# Patient Record
Sex: Female | Born: 1990 | Race: Black or African American | Hispanic: No | Marital: Single | Smoking: Never smoker
Health system: Southern US, Community
[De-identification: ages and names within clinical notes are randomized; demographics above are authoritative.]

## PROBLEM LIST (undated history)

## (undated) ENCOUNTER — Inpatient Hospital Stay (HOSPITAL_COMMUNITY): Payer: Self-pay

## (undated) DIAGNOSIS — Z8619 Personal history of other infectious and parasitic diseases: Secondary | ICD-10-CM

## (undated) DIAGNOSIS — D649 Anemia, unspecified: Secondary | ICD-10-CM

## (undated) DIAGNOSIS — N200 Calculus of kidney: Secondary | ICD-10-CM

## (undated) DIAGNOSIS — O24419 Gestational diabetes mellitus in pregnancy, unspecified control: Secondary | ICD-10-CM

## (undated) HISTORY — DX: Personal history of other infectious and parasitic diseases: Z86.19

## (undated) HISTORY — PX: NO PAST SURGERIES: SHX2092

---
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
## 2008-11-05 ENCOUNTER — Emergency Department (HOSPITAL_COMMUNITY): Admission: EM | Admit: 2008-11-05 | Discharge: 2008-11-05 | Payer: Self-pay | Admitting: Family Medicine

## 2009-04-26 ENCOUNTER — Emergency Department (HOSPITAL_COMMUNITY): Admission: EM | Admit: 2009-04-26 | Discharge: 2009-04-26 | Payer: Self-pay | Admitting: Emergency Medicine

## 2010-06-18 ENCOUNTER — Emergency Department (HOSPITAL_COMMUNITY)
Admission: EM | Admit: 2010-06-18 | Discharge: 2010-06-18 | Disposition: A | Discharge status: Home / Self Care | Payer: Self-pay | Attending: Emergency Medicine | Admitting: Emergency Medicine

## 2010-06-18 DIAGNOSIS — L02219 Cutaneous abscess of trunk, unspecified: Secondary | ICD-10-CM | POA: Insufficient documentation

## 2010-06-18 DIAGNOSIS — N94819 Vulvodynia, unspecified: Secondary | ICD-10-CM | POA: Insufficient documentation

## 2010-06-18 DIAGNOSIS — A499 Bacterial infection, unspecified: Secondary | ICD-10-CM | POA: Insufficient documentation

## 2010-06-18 DIAGNOSIS — L03319 Cellulitis of trunk, unspecified: Secondary | ICD-10-CM | POA: Insufficient documentation

## 2010-06-18 DIAGNOSIS — N760 Acute vaginitis: Secondary | ICD-10-CM | POA: Insufficient documentation

## 2010-06-18 DIAGNOSIS — R609 Edema, unspecified: Secondary | ICD-10-CM | POA: Insufficient documentation

## 2010-06-18 DIAGNOSIS — B9689 Other specified bacterial agents as the cause of diseases classified elsewhere: Secondary | ICD-10-CM | POA: Insufficient documentation

## 2010-06-18 LAB — WET PREP, GENITAL: Yeast Wet Prep HPF POC: NONE SEEN

## 2010-06-19 LAB — GC/CHLAMYDIA PROBE AMP, GENITAL

## 2010-06-20 ENCOUNTER — Emergency Department (HOSPITAL_COMMUNITY)
Admission: EM | Admit: 2010-06-20 | Discharge: 2010-06-20 | Disposition: A | Discharge status: Home / Self Care | Payer: Self-pay | Attending: Emergency Medicine | Admitting: Emergency Medicine

## 2010-06-20 DIAGNOSIS — L0291 Cutaneous abscess, unspecified: Secondary | ICD-10-CM | POA: Insufficient documentation

## 2010-06-20 DIAGNOSIS — Z09 Encounter for follow-up examination after completed treatment for conditions other than malignant neoplasm: Secondary | ICD-10-CM | POA: Insufficient documentation

## 2011-01-22 LAB — POCT URINALYSIS DIP (DEVICE)
Ketones, ur: 15 — AB
Protein, ur: 100 — AB
Urobilinogen, UA: >=8
pH: 5

## 2011-01-22 LAB — POCT PREGNANCY, URINE: Operator id: 247071

## 2011-01-22 LAB — URINE CULTURE: Culture: NO GROWTH

## 2011-03-25 ENCOUNTER — Encounter: Payer: Self-pay | Admitting: Emergency Medicine

## 2011-03-25 ENCOUNTER — Emergency Department (HOSPITAL_COMMUNITY)
Admission: EM | Admit: 2011-03-25 | Discharge: 2011-03-26 | Disposition: A | Discharge status: Home / Self Care | Payer: Self-pay | Attending: Emergency Medicine | Admitting: Emergency Medicine

## 2011-03-25 DIAGNOSIS — R05 Cough: Secondary | ICD-10-CM | POA: Insufficient documentation

## 2011-03-25 DIAGNOSIS — R059 Cough, unspecified: Secondary | ICD-10-CM | POA: Insufficient documentation

## 2011-03-25 DIAGNOSIS — J3489 Other specified disorders of nose and nasal sinuses: Secondary | ICD-10-CM | POA: Insufficient documentation

## 2011-03-25 DIAGNOSIS — R112 Nausea with vomiting, unspecified: Secondary | ICD-10-CM | POA: Insufficient documentation

## 2011-03-25 DIAGNOSIS — J029 Acute pharyngitis, unspecified: Secondary | ICD-10-CM | POA: Insufficient documentation

## 2011-03-25 NOTE — ED Notes (Signed)
Pt states that yesterday she started having a sore throat and cough.  Only nausea, denies v/d.  Sore throat 6/10.  Difficult to swallow.

## 2011-03-26 MED ORDER — AZITHROMYCIN 250 MG PO TABS: 250.0000 mg | ORAL_TABLET | Freq: Every day | ORAL | Status: AC

## 2011-03-26 NOTE — ED Provider Notes (Signed)
History     CSN: [id number] Arrival date & time: 03/25/2011  8:45 PM   First MD Initiated Contact with Patient 03/26/11 0111      Chief Complaint  Patient presents with  . Sore Throat  . Cough    (Consider location/radiation/quality/duration/timing/severity/associated sxs/prior treatment) Patient is a 20 y.o. female presenting with pharyngitis and cough. The history is provided by the patient.  Sore Throat The current episode started yesterday. The problem occurs constantly. The problem has been gradually worsening. Associated symptoms include congestion, coughing, nausea, a sore throat and vomiting. Pertinent negatives include no abdominal pain, chest pain, chills, fever or rash. The symptoms are aggravated by swallowing and coughing.  Cough Associated symptoms include sore throat. Pertinent negatives include no chest pain, no chills, no shortness of breath and no wheezing.    History reviewed. No pertinent past medical history.  History reviewed. No pertinent past surgical history.  No family history on file.  History  Substance Use Topics  . Smoking status: Never Smoker   . Smokeless tobacco: Not on file  . Alcohol Use: No    OB History    Grav Para Term Preterm Abortions TAB SAB Ect Mult Living                  Review of Systems  Constitutional: Negative for fever and chills.  HENT: Positive for congestion and sore throat.   Respiratory: Positive for cough. Negative for shortness of breath and wheezing.   Cardiovascular: Negative.  Negative for chest pain.  Gastrointestinal: Positive for nausea and vomiting. Negative for abdominal pain.  Musculoskeletal: Negative.   Skin: Negative.  Negative for rash.  Neurological: Negative.     Allergies  Review of patient's allergies indicates no known allergies.  Home Medications  No current outpatient prescriptions on file.  BP 120/65  Pulse 85  Temp(Src) 99.5 F (37.5 C) (Oral)  Resp 18  SpO2 99%  Physical  Exam  Constitutional: She appears well-developed and well-nourished.  HENT:  Head: Normocephalic. No trismus in the jaw.  Nose: Mucosal edema and rhinorrhea present.  Mouth/Throat: Mucous membranes are normal. Posterior oropharyngeal erythema present. No oropharyngeal exudate or tonsillar abscesses.  Neck: Normal range of motion. Neck supple.  Cardiovascular: Normal rate and regular rhythm.   Pulmonary/Chest: Effort normal and breath sounds normal.  Abdominal: Soft. Bowel sounds are normal. There is no tenderness. There is no rebound and no guarding.  Musculoskeletal: Normal range of motion.  Neurological: She is alert. No cranial nerve deficit.  Skin: Skin is warm and dry. No rash noted.  Psychiatric: She has a normal mood and affect.    ED Course  Procedures (including critical care time)  Labs Reviewed - No data to display No results found.   No diagnosis found.    MDM          Rodena Medin, PA 03/26/11 [phone]

## 2011-03-26 NOTE — ED Provider Notes (Signed)
Medical screening examination/treatment/procedure(s) were performed by non-physician practitioner and as supervising physician I was immediately available for consultation/collaboration.  Darald Uzzle M Brendan Gruwell, MD 03/26/11 0820 

## 2012-03-21 ENCOUNTER — Emergency Department (HOSPITAL_COMMUNITY)
Admission: EM | Admit: 2012-03-21 | Discharge: 2012-03-21 | Disposition: A | Discharge status: Home / Self Care | Payer: Self-pay | Attending: Emergency Medicine | Admitting: Emergency Medicine

## 2012-03-21 ENCOUNTER — Encounter (HOSPITAL_COMMUNITY): Payer: Self-pay | Admitting: Emergency Medicine

## 2012-03-21 DIAGNOSIS — N390 Urinary tract infection, site not specified: Secondary | ICD-10-CM | POA: Insufficient documentation

## 2012-03-21 DIAGNOSIS — R300 Dysuria: Secondary | ICD-10-CM | POA: Insufficient documentation

## 2012-03-21 LAB — URINALYSIS, ROUTINE W REFLEX MICROSCOPIC
Hgb urine dipstick: NEGATIVE
Nitrite: POSITIVE — AB
Specific Gravity, Urine: 1.024 (ref 1.005–1.030)
Urobilinogen, UA: 1 mg/dL (ref 0.0–1.0)
pH: 5.5 (ref 5.0–8.0)

## 2012-03-21 LAB — PREGNANCY, URINE: Preg Test, Ur: NEGATIVE

## 2012-03-21 LAB — URINE MICROSCOPIC-ADD ON

## 2012-03-21 MED ORDER — SULFAMETHOXAZOLE-TMP DS 800-160 MG PO TABS
1.0000 | ORAL_TABLET | Freq: Once | ORAL | Status: AC
  Administered 2012-03-21: 1 via ORAL
  Filled 2012-03-21: qty 1

## 2012-03-21 MED ORDER — SULFAMETHOXAZOLE-TRIMETHOPRIM 800-160 MG PO TABS: 1.0000 | ORAL_TABLET | Freq: Two times a day (BID) | ORAL | Status: DC

## 2012-03-21 NOTE — ED Provider Notes (Signed)
Medical screening examination/treatment/procedure(s) were performed by non-physician practitioner and as supervising physician I was immediately available for consultation/collaboration.  Jackson Fetters, MD 03/21/12 2356 

## 2012-03-21 NOTE — ED Notes (Signed)
Patient states that she is having burning with urination and urinary frequency

## 2012-03-21 NOTE — ED Provider Notes (Signed)
History     CSN: [id number]  Arrival date & time 03/21/12  1857   First MD Initiated Contact with Patient 03/21/12 2012      Chief Complaint  Patient presents with  . Urinary Frequency    (Consider location/radiation/quality/duration/timing/severity/associated sxs/prior treatment) HPI Comments: Patient presents with complaint is dysuria and urinary frequency X 2 days. She states that she has had frequent UTI's and that this feels similar. Denies fever or chills. Denies NVD or abdominal pain. Denies vaginal discharge. Denies flank pain. LMP: 11.1.13  The history is provided by the patient. No language interpreter was used.    History reviewed. No pertinent past medical history.  History reviewed. No pertinent past surgical history.  No family history on file.  History  Substance Use Topics  . Smoking status: Never Smoker   . Smokeless tobacco: Not on file  . Alcohol Use: No    OB History    Grav Para Term Preterm Abortions TAB SAB Ect Mult Living                  Review of Systems  Constitutional: Negative for fever and chills.  Gastrointestinal: Negative for nausea, vomiting, abdominal pain and diarrhea.  Genitourinary: Positive for dysuria and frequency. Negative for flank pain and vaginal discharge.    Allergies  Review of patient's allergies indicates no known allergies.  Home Medications   Current Outpatient Rx  Name  Route  Sig  Dispense  Refill  . SULFAMETHOXAZOLE-TRIMETHOPRIM 800-160 MG PO TABS   Oral   Take 1 tablet by mouth every 12 (twelve) hours.   6 tablet   0     BP 120/56  Pulse 66  Temp 98.7 F (37.1 C) (Oral)  Resp 18  SpO2 100%  Physical Exam  Nursing note and vitals reviewed. Constitutional: She appears well-developed and well-nourished.  HENT:  Head: Normocephalic and atraumatic.  Mouth/Throat: Oropharynx is clear and moist.  Eyes: Conjunctivae normal and EOM are normal. No scleral icterus.  Neck: Normal range of motion.  Neck supple.  Cardiovascular: Normal rate, regular rhythm and normal heart sounds.   Pulmonary/Chest: Effort normal and breath sounds normal.  Abdominal: Soft. Bowel sounds are normal. There is no tenderness.       No suprapubic tenderness. No CVA tenderness.  Neurological: She is alert.  Skin: Skin is warm and dry.    ED Course  Procedures (including critical care time)  Labs Reviewed  URINALYSIS, ROUTINE W REFLEX MICROSCOPIC - Abnormal; Notable for the following:    Color, Urine ORANGE (*)  BIOCHEMICALS MAY BE AFFECTED BY COLOR   APPearance CLOUDY (*)     Nitrite POSITIVE (*)     Leukocytes, UA LARGE (*)     All other components within normal limits  URINE MICROSCOPIC-ADD ON - Abnormal; Notable for the following:    Squamous Epithelial / LPF FEW (*)     Bacteria, UA MANY (*)     All other components within normal limits  PREGNANCY, URINE  URINE CULTURE   Results for orders placed during the hospital encounter of 03/21/12  URINALYSIS, ROUTINE W REFLEX MICROSCOPIC      Component Value Range   Color, Urine ORANGE (*) YELLOW   APPearance CLOUDY (*) CLEAR   Specific Gravity, Urine 1.024  1.005 - 1.030   pH 5.5  5.0 - 8.0   Glucose, UA NEGATIVE  NEGATIVE mg/dL   Hgb urine dipstick NEGATIVE  NEGATIVE   Bilirubin Urine NEGATIVE  NEGATIVE  Ketones, ur NEGATIVE  NEGATIVE mg/dL   Protein, ur NEGATIVE  NEGATIVE mg/dL   Urobilinogen, UA 1.0  0.0 - 1.0 mg/dL   Nitrite POSITIVE (*) NEGATIVE   Leukocytes, UA LARGE (*) NEGATIVE  PREGNANCY, URINE      Component Value Range   Preg Test, Ur NEGATIVE  NEGATIVE  URINE MICROSCOPIC-ADD ON      Component Value Range   Squamous Epithelial / LPF FEW (*) RARE   WBC, UA 21-50  <3 WBC/hpf   Bacteria, UA MANY (*) RARE   Urine-Other MUCOUS PRESENT      No results found.   1. UTI (lower urinary tract infection)       MDM  Patient presented with urinary symptoms. Urine remarkable for leukocytes and nitrite. Patient discharged with Rx  for bactrim and return precautions. No red flags for pyelonephritis.         Pixie Casino, PA-C 03/21/12 2127

## 2012-03-22 LAB — URINE CULTURE: Colony Count: 30000

## 2012-04-04 ENCOUNTER — Emergency Department (HOSPITAL_COMMUNITY)
Admission: EM | Admit: 2012-04-04 | Discharge: 2012-04-04 | Disposition: A | Discharge status: Home / Self Care | Payer: Self-pay | Attending: Emergency Medicine | Admitting: Emergency Medicine

## 2012-04-04 DIAGNOSIS — N390 Urinary tract infection, site not specified: Secondary | ICD-10-CM | POA: Insufficient documentation

## 2012-04-04 DIAGNOSIS — Z3202 Encounter for pregnancy test, result negative: Secondary | ICD-10-CM | POA: Insufficient documentation

## 2012-04-04 DIAGNOSIS — N898 Other specified noninflammatory disorders of vagina: Secondary | ICD-10-CM | POA: Insufficient documentation

## 2012-04-04 LAB — URINALYSIS, ROUTINE W REFLEX MICROSCOPIC
Nitrite: POSITIVE — AB
Protein, ur: NEGATIVE mg/dL
Urobilinogen, UA: 1 mg/dL (ref 0.0–1.0)

## 2012-04-04 LAB — URINE MICROSCOPIC-ADD ON

## 2012-04-04 LAB — WET PREP, GENITAL

## 2012-04-04 MED ORDER — CEPHALEXIN 500 MG PO CAPS: 500.0000 mg | ORAL_CAPSULE | Freq: Four times a day (QID) | ORAL | Status: DC

## 2012-04-04 NOTE — ED Provider Notes (Signed)
History     CSN: [id number]  Arrival date & time 04/04/12  1646   First MD Initiated Contact with Patient 04/04/12 2003      Chief Complaint  Patient presents with  . Urinary Tract Infection    (Consider location/radiation/quality/duration/timing/severity/associated sxs/prior treatment) HPI History provided by pt.   Pt presents for the second time in 2 weeks w/ c/o dysuria.  Was diagnosed w/ UTI on 11/25 and prescribed bactrim.  Sx have worsened despite compliance w/ medication.  Now has constant vulvar burning as well as increased urinary frequency.  Now with white vaginal discharge as well.  Denies fever, abd/pelvic/low back pain, vomiting, dyspareunia, genitalia rash.  No PMH.   No past medical history on file.  No past surgical history on file.  No family history on file.  History  Substance Use Topics  . Smoking status: Never Smoker   . Smokeless tobacco: Not on file  . Alcohol Use: No    OB History    Grav Para Term Preterm Abortions TAB SAB Ect Mult Living                  Review of Systems  All other systems reviewed and are negative.    Allergies  Review of patient's allergies indicates no known allergies.  Home Medications  No current outpatient prescriptions on file.  BP 114/61  Pulse 77  Temp 99.1 F (37.3 C)  Resp 15  SpO2 98%  Physical Exam  Nursing note and vitals reviewed. Constitutional: She is oriented to person, place, and time. She appears well-developed and well-nourished. No distress.  HENT:  Head: Normocephalic and atraumatic.  Eyes:       Normal appearance  Neck: Normal range of motion.  Cardiovascular: Normal rate and regular rhythm.   Pulmonary/Chest: Effort normal and breath sounds normal. No respiratory distress.  Abdominal: Soft. Bowel sounds are normal. She exhibits no distension and no mass. There is no tenderness. There is no rebound and no guarding.  Genitourinary:       No CVA tenderness.  Nml external genitalia.  No  vaginal discharge/bleeding.  Cervix closed and appears nml.  No adnexal or cervical motion tenderess.    Musculoskeletal: Normal range of motion.  Neurological: She is alert and oriented to person, place, and time.  Skin: Skin is warm and dry. No rash noted.  Psychiatric: She has a normal mood and affect. Her behavior is normal.    ED Course  Procedures (including critical care time)  Labs Reviewed  URINALYSIS, ROUTINE W REFLEX MICROSCOPIC - Abnormal; Notable for the following:    Color, Urine ORANGE (*)  BIOCHEMICALS MAY BE AFFECTED BY COLOR   APPearance CLOUDY (*)     Bilirubin Urine SMALL (*)     Nitrite POSITIVE (*)     Leukocytes, UA LARGE (*)     All other components within normal limits  WET PREP, GENITAL - Abnormal; Notable for the following:    Yeast Wet Prep HPF POC RARE (*)     WBC, Wet Prep HPF POC FEW (*)     All other components within normal limits  URINE MICROSCOPIC-ADD ON - Abnormal; Notable for the following:    Squamous Epithelial / LPF FEW (*)     Bacteria, UA FEW (*)     All other components within normal limits  PREGNANCY, URINE  GC/CHLAMYDIA PROBE AMP  URINE CULTURE   No results found.   1. Urinary tract infection  MDM  21yo F presents w/ dysuria.  Compliant w/ bactrim, prescribed for UTI in ED on 03/21/12 and sx have worsened.  Now w/ vaginal discharge as well.  On exam, afebrile, abd benign, nml genitalia.  U/A and wet prep pending.  Urine sent for culture as well. 9:04 PM   U/A positive for infection.  Will treat w/ kelfex in case of pyelo, though unlikely based on symptoms and exam.  [facility]  Return precautions discussed. 10:06 PM         Otilio Miu, PA-C 04/04/12 2206

## 2012-04-04 NOTE — ED Notes (Signed)
Bed:WA04<BR> Expected date:<BR> Expected time:<BR> Means of arrival:<BR> Comments:<BR> triage

## 2012-04-04 NOTE — ED Notes (Signed)
Pt was last seen in ED a week ago for uti.  Pt was given antibotic.  Pt reports taking all med but has had no relief.  Pt reports burning with urination and irritation.  Pt denies n/v, chills or fever. Pt does not remember the med prescribed

## 2012-04-04 NOTE — ED Notes (Addendum)
PA at bedside completing pelvic with tech.

## 2012-04-06 ENCOUNTER — Telehealth (HOSPITAL_COMMUNITY): Payer: Self-pay | Admitting: Emergency Medicine

## 2012-04-06 LAB — URINE CULTURE

## 2012-04-06 NOTE — ED Notes (Signed)
Pt called for lab results.  ID verified x 2.  Informed of (+) Chlamydia.  Informed pt chart would go to MD for review and then we would call her w/tx requested per MD.  Pt informed to notify partner(s) for testing and tx and abstain from sex x 2 wks once treated.

## 2012-04-09 NOTE — ED Provider Notes (Signed)
History/physical exam/procedure(s) were performed by non-physician practitioner and as supervising physician I was immediately available for consultation/collaboration. I have reviewed all notes and am in agreement with care and plan.   Nickola Lenig S Clovis Mankins, MD 04/09/12 1349 

## 2012-04-10 ENCOUNTER — Encounter (HOSPITAL_COMMUNITY): Payer: Self-pay | Admitting: *Deleted

## 2012-04-10 ENCOUNTER — Emergency Department (HOSPITAL_COMMUNITY)
Admission: EM | Admit: 2012-04-10 | Discharge: 2012-04-10 | Disposition: A | Discharge status: Home / Self Care | Payer: Self-pay | Attending: Emergency Medicine | Admitting: Emergency Medicine

## 2012-04-10 DIAGNOSIS — L299 Pruritus, unspecified: Secondary | ICD-10-CM | POA: Insufficient documentation

## 2012-04-10 DIAGNOSIS — N390 Urinary tract infection, site not specified: Secondary | ICD-10-CM | POA: Insufficient documentation

## 2012-04-10 DIAGNOSIS — B373 Candidiasis of vulva and vagina: Secondary | ICD-10-CM | POA: Insufficient documentation

## 2012-04-10 DIAGNOSIS — R350 Frequency of micturition: Secondary | ICD-10-CM | POA: Insufficient documentation

## 2012-04-10 DIAGNOSIS — B3731 Acute candidiasis of vulva and vagina: Secondary | ICD-10-CM | POA: Insufficient documentation

## 2012-04-10 DIAGNOSIS — R319 Hematuria, unspecified: Secondary | ICD-10-CM | POA: Insufficient documentation

## 2012-04-10 DIAGNOSIS — M549 Dorsalgia, unspecified: Secondary | ICD-10-CM | POA: Insufficient documentation

## 2012-04-10 LAB — WET PREP, GENITAL: Trich, Wet Prep: NONE SEEN

## 2012-04-10 LAB — URINALYSIS, ROUTINE W REFLEX MICROSCOPIC
Glucose, UA: NEGATIVE mg/dL
Specific Gravity, Urine: 1.025 (ref 1.005–1.030)
Urobilinogen, UA: 1 mg/dL (ref 0.0–1.0)

## 2012-04-10 LAB — URINE MICROSCOPIC-ADD ON

## 2012-04-10 MED ORDER — LEVOFLOXACIN 750 MG PO TABS: 750.0000 mg | ORAL_TABLET | Freq: Every day | ORAL | Status: DC

## 2012-04-10 MED ORDER — CEFTRIAXONE SODIUM 1 G IJ SOLR
500.0000 mg | Freq: Once | INTRAMUSCULAR | Status: AC
  Administered 2012-04-10: 500 mg via INTRAMUSCULAR
  Filled 2012-04-10: qty 10

## 2012-04-10 MED ORDER — LEVOFLOXACIN 500 MG PO TABS
750.0000 mg | ORAL_TABLET | Freq: Once | ORAL | Status: AC
  Administered 2012-04-10: 750 mg via ORAL
  Filled 2012-04-10: qty 2

## 2012-04-10 MED ORDER — LEVOFLOXACIN 500 MG PO TABS: 750.0000 mg | ORAL_TABLET | Freq: Every day | ORAL | Status: DC

## 2012-04-10 MED ORDER — FLUCONAZOLE 150 MG PO TABS: 150.0000 mg | ORAL_TABLET | Freq: Once | ORAL | Status: DC

## 2012-04-10 MED ORDER — LIDOCAINE HCL 1 % IJ SOLN
INTRAMUSCULAR | Status: AC
  Administered 2012-04-10: 2.1 mL
  Filled 2012-04-10: qty 20

## 2012-04-10 MED ORDER — NITROFURANTOIN MONOHYD MACRO 100 MG PO CAPS
100.0000 mg | ORAL_CAPSULE | Freq: Once | ORAL | Status: DC
  Filled 2012-04-10: qty 1

## 2012-04-10 NOTE — ED Notes (Signed)
Pt reports being seen 12/09 for UTI, had previously been treated for UTI and completed all antibiotic medications. 12/09 given another antibiotic for UTI which pt has completed. Pt alerted 12/11 (+) chlamydia, [facility].  Pt reports back pain, nausea, and dysuria, 9/10 pain. Pt reports no relief from dysuria since the first treatment for UTI.  Pt reports when "a little bit of blood" when she wipes after urination.  Back pain started 12/11.

## 2012-04-10 NOTE — ED Provider Notes (Signed)
History     CSN: [id number]  Arrival date & time 04/10/12  1058   First MD Initiated Contact with Patient 04/10/12 1110      No chief complaint on file.   (Consider location/radiation/quality/duration/timing/severity/associated sxs/prior treatment) Lauren Collins is a 21 y.o. female present to the ER for the second time with dysuria frequency and vaginal itch. Patient has been treated twice with Keflex and Bactrim for urinary tract infection however these cultures failed to grow. Patient says she's still having symptoms including back pain, hematuria, dysuria which is a 9/10 in intensity, not associated with vomiting she has had some mild nausea. No diarrhea, no fevers, no chills, no shortness of breath or chest pain. Patient also complains about 2-3 days of cough, mild rhinorrhea. Denies any sore throat or sinus pain or pressure. No headaches.  History reviewed. No pertinent past medical history.  History reviewed. No pertinent past surgical history.  History reviewed. No pertinent family history.  History  Substance Use Topics  . Smoking status: Never Smoker   . Smokeless tobacco: Not on file  . Alcohol Use: No    OB History    Grav Para Term Preterm Abortions TAB SAB Ect Mult Living                  Review of Systems At least 10pt or greater review of systems completed and are negative except where specified in the HPI.  Allergies  Review of patient's allergies indicates no known allergies.  Home Medications   Current Outpatient Rx  Name  Route  Sig  Dispense  Refill  . CEPHALEXIN 500 MG PO CAPS   Oral   Take 1 capsule (500 mg total) by mouth 4 (four) times daily.   20 capsule   0     BP 118/51  Pulse 76  Temp 98.6 F (37 C) (Oral)  Resp 16  SpO2 96%  Physical Exam  Nursing notes reviewed.  Electronic medical record reviewed. VITAL SIGNS:   Filed Vitals:   04/10/12 1102  BP: 118/51  Pulse: 76  Temp: 98.6 F (37 C)  TempSrc: Oral  Resp: 16   SpO2: 96%   CONSTITUTIONAL: Awake, oriented, appears non-toxic HENT: Atraumatic, normocephalic, oral mucosa pink and moist, airway patent. Nares patent with clear drainage and boggy turbinates. External ears normal. EYES: Conjunctiva clear, EOMI, PERRLA NECK: Trachea midline, non-tender, supple CARDIOVASCULAR: Normal heart rate, Normal rhythm, No murmurs, rubs, gallops PULMONARY/CHEST: Clear to auscultation, no rhonchi, wheezes, or rales. Symmetrical breath sounds. Non-tender. ABDOMINAL: Non-distended, soft, non-tender - no rebound or guarding.  BS normal. GU: Patient has white discharge at the vulva with some erythematous irritation and excoriation. NEUROLOGIC: Non-focal, moving all four extremities, no gross sensory or motor deficits. EXTREMITIES: No clubbing, cyanosis, or edema SKIN: Warm, Dry, No erythema, No rash  ED Course  Procedures (including critical care time)  Labs Reviewed  URINALYSIS, ROUTINE W REFLEX MICROSCOPIC - Abnormal; Notable for the following:    APPearance CLOUDY (*)     Hgb urine dipstick TRACE (*)     Leukocytes, UA LARGE (*)     All other components within normal limits  WET PREP, GENITAL - Abnormal; Notable for the following:    Yeast Wet Prep HPF POC FEW (*)     WBC, Wet Prep HPF POC MODERATE (*)     All other components within normal limits  URINE MICROSCOPIC-ADD ON - Abnormal; Notable for the following:    Squamous Epithelial / LPF MANY (*)  Bacteria, UA MANY (*)     All other components within normal limits  URINE CULTURE   No results found.   1. UTI (lower urinary tract infection)   2. Yeast vaginitis       MDM  Lauren Collins is a 21 y.o. female presents with symptoms significant for UTI, I do not think the patient is a pyelonephritis at this time.  Patient's cultures have not pronounced-abdomen polymicrobial consider cultures have been terminated. We'll put the patient on levofloxacin as well as given her a 500 mg dose of IM Rocephin.  We'll also give the patient some fluconazole for her yeast vaginitis. She was also [facility] the [facility] for Chlamydia which was positive (in this ED) and so Rocephin we'll also cover for gonorrhea in case of a false-negative.  Patient is given explicit instructions to return to emergency department if she develops pyelonephritis with symptoms such as worsening back pain, high fevers, vomiting, inability to keep down fluids or any other concerning symptoms. The patient understands and accepts the medical plan as it's been dictated. The patient is STABLE and is discharged to home in good condition.        Jones Skene, MD 04/10/12 1718

## 2012-04-12 LAB — URINE CULTURE: Colony Count: 40000

## 2012-04-14 NOTE — ED Notes (Signed)
+   urine  Patient treated appropriately -sensitive to same-chart appended per protocol MD.  

## 2012-05-04 ENCOUNTER — Emergency Department (HOSPITAL_COMMUNITY)
Admission: EM | Admit: 2012-05-04 | Discharge: 2012-05-04 | Disposition: A | Discharge status: Home / Self Care | Payer: Self-pay | Attending: Emergency Medicine | Admitting: Emergency Medicine

## 2012-05-04 ENCOUNTER — Encounter (HOSPITAL_COMMUNITY): Payer: Self-pay | Admitting: *Deleted

## 2012-05-04 ENCOUNTER — Emergency Department (HOSPITAL_COMMUNITY): Payer: Self-pay

## 2012-05-04 DIAGNOSIS — N23 Unspecified renal colic: Secondary | ICD-10-CM | POA: Insufficient documentation

## 2012-05-04 DIAGNOSIS — N201 Calculus of ureter: Secondary | ICD-10-CM | POA: Insufficient documentation

## 2012-05-04 DIAGNOSIS — R1110 Vomiting, unspecified: Secondary | ICD-10-CM | POA: Insufficient documentation

## 2012-05-04 DIAGNOSIS — R197 Diarrhea, unspecified: Secondary | ICD-10-CM | POA: Insufficient documentation

## 2012-05-04 DIAGNOSIS — Z3202 Encounter for pregnancy test, result negative: Secondary | ICD-10-CM | POA: Insufficient documentation

## 2012-05-04 DIAGNOSIS — R319 Hematuria, unspecified: Secondary | ICD-10-CM | POA: Insufficient documentation

## 2012-05-04 DIAGNOSIS — Z79899 Other long term (current) drug therapy: Secondary | ICD-10-CM | POA: Insufficient documentation

## 2012-05-04 LAB — CBC WITH DIFFERENTIAL/PLATELET
Basophils Relative: 0 % (ref 0–1)
Eosinophils Absolute: 0.1 10*3/uL (ref 0.0–0.7)
Eosinophils Relative: 2 % (ref 0–5)
MCH: 25.8 pg — ABNORMAL LOW (ref 26.0–34.0)
MCHC: 32.7 g/dL (ref 30.0–36.0)
MCV: 78.8 fL (ref 78.0–100.0)
Neutrophils Relative %: 74 % (ref 43–77)
Platelets: 281 10*3/uL (ref 150–400)

## 2012-05-04 LAB — URINALYSIS, ROUTINE W REFLEX MICROSCOPIC
Bilirubin Urine: NEGATIVE
Leukocytes, UA: NEGATIVE
Nitrite: NEGATIVE
Specific Gravity, Urine: 1.022 (ref 1.005–1.030)
Urobilinogen, UA: 0.2 mg/dL (ref 0.0–1.0)
pH: 7.5 (ref 5.0–8.0)

## 2012-05-04 LAB — URINE MICROSCOPIC-ADD ON

## 2012-05-04 LAB — COMPREHENSIVE METABOLIC PANEL
Albumin: 3.8 g/dL (ref 3.5–5.2)
Alkaline Phosphatase: 75 U/L (ref 39–117)
BUN: 13 mg/dL (ref 6–23)
Calcium: 9.2 mg/dL (ref 8.4–10.5)
GFR calc Af Amer: >90 mL/min (ref >90)
Glucose, Bld: 129 mg/dL — ABNORMAL HIGH (ref 70–99)
Potassium: 3.4 mEq/L — ABNORMAL LOW (ref 3.5–5.1)
Sodium: 139 mEq/L (ref 135–145)
Total Protein: 7.7 g/dL (ref 6.0–8.3)

## 2012-05-04 LAB — RAPID URINE DRUG SCREEN, HOSP PERFORMED
Barbiturates: NOT DETECTED
Benzodiazepines: NOT DETECTED
Cocaine: NOT DETECTED
Opiates: NOT DETECTED

## 2012-05-04 LAB — LIPASE, BLOOD: Lipase: 15 U/L (ref 11–59)

## 2012-05-04 MED ORDER — ONDANSETRON HCL 4 MG/2ML IJ SOLN
4.0000 mg | Freq: Once | INTRAMUSCULAR | Status: AC
  Administered 2012-05-04: 4 mg via INTRAVENOUS
  Filled 2012-05-04: qty 2

## 2012-05-04 MED ORDER — OXYCODONE-ACETAMINOPHEN 5-325 MG PO TABS: 1.0000 | ORAL_TABLET | ORAL | Status: DC | PRN

## 2012-05-04 MED ORDER — SODIUM CHLORIDE 0.9 % IV BOLUS (SEPSIS)
1000.0000 mL | Freq: Once | INTRAVENOUS | Status: AC
  Administered 2012-05-04: 1000 mL via INTRAVENOUS

## 2012-05-04 MED ORDER — HYDROMORPHONE HCL PF 1 MG/ML IJ SOLN
1.0000 mg | Freq: Once | INTRAMUSCULAR | Status: AC
  Administered 2012-05-04: 1 mg via INTRAVENOUS
  Filled 2012-05-04: qty 1

## 2012-05-04 MED ORDER — SODIUM CHLORIDE 0.9 % IV SOLN
INTRAVENOUS | Status: DC
  Administered 2012-05-04: 10:00:00 via INTRAVENOUS

## 2012-05-04 NOTE — ED Provider Notes (Signed)
History     CSN: [id number]  Arrival date & time 05/04/12  0556   First MD Initiated Contact with Patient 05/04/12 [phone]      Chief Complaint  Patient presents with  . Abdominal Pain    (Consider location/radiation/quality/duration/timing/severity/associated sxs/prior treatment) HPI Comments: Lauren Collins is a 22 y.o. Female who presents for evaluation of acute onset of right sided abdominal pain, at 4 AM today. It is associated with of vomiting and diarrhea. She denies fever, chills, cough, shortness of breath, weakness, or dizziness. She's not having dysuria, urinary frequency, or back pain. She does not know when her last menstrual cycle was. She uses Depo-Provera. There are no modifying factors.  The history is provided by the patient.    History reviewed. No pertinent past medical history.  History reviewed. No pertinent past surgical history.  History reviewed. No pertinent family history.  History  Substance Use Topics  . Smoking status: Never Smoker   . Smokeless tobacco: Not on file  . Alcohol Use: No    OB History    Grav Para Term Preterm Abortions TAB SAB Ect Mult Living                  Review of Systems  All other systems reviewed and are negative.    Allergies  Review of patient's allergies indicates no known allergies.  Home Medications   Current Outpatient Rx  Name  Route  Sig  Dispense  Refill  . MEDROXYPROGESTERONE ACETATE 150 MG/ML IM SUSP   Intramuscular   Inject 150 mg into the muscle every 3 (three) months.         . OXYCODONE-ACETAMINOPHEN 5-325 MG PO TABS   Oral   Take 1 tablet by mouth every 4 (four) hours as needed for pain.   20 tablet   0     BP 116/64  Pulse 59  Temp 98.4 F (36.9 C)  Resp 16  SpO2 100%  Physical Exam  Nursing note and vitals reviewed. Constitutional: She is oriented to person, place, and time. She appears well-developed and well-nourished.  HENT:  Head: Normocephalic and atraumatic.  Eyes:  Conjunctivae normal and EOM are normal. Pupils are equal, round, and reactive to light.  Neck: Normal range of motion and phonation normal. Neck supple.  Cardiovascular: Normal rate, regular rhythm and intact distal pulses.   Pulmonary/Chest: Effort normal and breath sounds normal. She exhibits no tenderness.  Abdominal: Soft. She exhibits no distension and no mass. There is tenderness. There is no rebound and no guarding.       Mild diffuse, right-sided abdominal tenderness  Musculoskeletal: Normal range of motion.  Neurological: She is alert and oriented to person, place, and time. She has normal strength. She exhibits normal muscle tone.  Skin: Skin is warm and dry.  Psychiatric: She has a normal mood and affect. Her behavior is normal. Judgment and thought content normal.    ED Course  Procedures (including critical care time)  Emergency department treatment: IV fluids, bolus, and drip. IV, Zofran.  Treated for persistent pain with IV, Dilaudid  Reevaluation:10:40-   Labs Reviewed  CBC WITH DIFFERENTIAL - Abnormal; Notable for the following:    MCH 25.8 (*)     All other components within normal limits  COMPREHENSIVE METABOLIC PANEL - Abnormal; Notable for the following:    Potassium 3.4 (*)     Glucose, Bld 129 (*)     All other components within normal limits  URINALYSIS, ROUTINE W  REFLEX MICROSCOPIC - Abnormal; Notable for the following:    APPearance CLOUDY (*)     Hgb urine dipstick LARGE (*)     All other components within normal limits  LIPASE, BLOOD  URINE RAPID DRUG SCREEN (HOSP PERFORMED)  ETHANOL  PREGNANCY, URINE  URINE MICROSCOPIC-ADD ON   Ct Abdomen Pelvis Wo Contrast  05/04/2012  *RADIOLOGY REPORT*  Clinical Data: Right lower quadrant abdominal pain.  CT ABDOMEN AND PELVIS WITHOUT CONTRAST  Technique:  Multidetector CT imaging of the abdomen and pelvis was performed following the standard protocol without intravenous contrast.  Comparison: None  Findings: The  lung bases are clear.  The distal esophagus is unremarkable.  No pleural effusion.  The unenhanced appearance of the liver is unremarkable.  No focal hepatic lesions or intrahepatic ductal dilatation. The gallbladder is normal.  No common bile duct dilatation.  The pancreas is grossly normal.  The spleen is normal in size.  No focal lesions. The adrenal glands and left kidney are normal.  The right kidney demonstrates mild hydronephrosis. There is also mild right hydroureter down to an obstructing 2 mm right UVJ calculus.  The stomach, duodenum, small bowel and colon are grossly normal without oral contrast.  The appendix is visualized and is normal. A small appendicolith is noted.  The uterus and ovaries are unremarkable.  The bladder is normal.  No pelvic mass, adenopathy or free pelvic fluid collections.  No mesenteric or retroperitoneal mass or adenopathy.  The aorta is normal in caliber.  The bony structures are intact.  IMPRESSION:  2 mm right UVJ calculus causing mild right-sided hydroureteronephrosis.   Original Report Authenticated By: Rudie Meyer, M.D.    Nursing notes, applicable records and vitals reviewed.  Radiologic Images/Reports reviewed.   1. Ureteral stone   2. Ureteral colic   3. Hematuria       MDM  Small, distal ureteral stone with secondary pain. No evidence for UTI, or renal dysfunction.Doubt metabolic instability, serious bacterial infection or impending vascular collapse; the patient is stable for discharge.     Plan: Home Medications- Percocet; Home Treatments- strain. Your; Recommended follow up- urology. One week     Flint Melter, MD 05/04/12 1446

## 2012-05-04 NOTE — ED Notes (Signed)
Pt c/o severe right sided abd pain since 4 am; actively vomiting in triage

## 2013-08-28 ENCOUNTER — Emergency Department (HOSPITAL_COMMUNITY)
Admission: EM | Admit: 2013-08-28 | Discharge: 2013-08-28 | Disposition: A | Discharge status: Home / Self Care | Payer: No Typology Code available for payment source | Attending: Emergency Medicine | Admitting: Emergency Medicine

## 2013-08-28 ENCOUNTER — Encounter (HOSPITAL_COMMUNITY): Payer: Self-pay | Admitting: Emergency Medicine

## 2013-08-28 DIAGNOSIS — L0390 Cellulitis, unspecified: Secondary | ICD-10-CM

## 2013-08-28 DIAGNOSIS — L03319 Cellulitis of trunk, unspecified: Principal | ICD-10-CM

## 2013-08-28 DIAGNOSIS — L02219 Cutaneous abscess of trunk, unspecified: Secondary | ICD-10-CM | POA: Insufficient documentation

## 2013-08-28 MED ORDER — SULFAMETHOXAZOLE-TRIMETHOPRIM 800-160 MG PO TABS: 1.0000 | ORAL_TABLET | Freq: Two times a day (BID) | ORAL | Status: DC

## 2013-08-28 MED ORDER — CEPHALEXIN 500 MG PO CAPS: ORAL_CAPSULE | ORAL | Status: DC

## 2013-08-28 NOTE — ED Notes (Signed)
PT states she has lump on chest wall below breasts. Pt watched it for a week in case it was abscess, but has not changed. Pt states it feels hard.

## 2013-08-28 NOTE — Discharge Instructions (Signed)
Cellulitis °Cellulitis is an infection of the skin and the tissue under the skin. The infected area is usually red and tender. This happens most often in the arms and lower legs. °HOME CARE  °· Take your antibiotic medicine as told. Finish the medicine even if you start to feel better. °· Keep the infected arm or leg raised (elevated). °· Put a warm cloth on the area up to 4 times per day. °· Only take medicines as told by your doctor. °· Keep all doctor visits as told. °GET HELP RIGHT AWAY IF:  °· You have a fever. °· You feel very sleepy. °· You throw up (vomit) or have watery poop (diarrhea). °· You feel sick and have muscle aches and pains. °· You see red streaks on the skin coming from the infected area. °· Your red area gets bigger or turns a dark color. °· Your bone or joint under the infected area is painful after the skin heals. °· Your infection comes back in the same area or different area. °· You have a puffy (swollen) bump in the infected area. °· You have new symptoms. °MAKE SURE YOU:  °· Understand these instructions. °· Will watch your condition. °· Will get help right away if you are not doing well or get worse. °Document Released: 09/30/2007 Document Revised: 10/13/2011 Document Reviewed: 06/29/2011 °Exit[employer]® Patient Information ©2014 [employer]. ° °

## 2013-08-28 NOTE — ED Provider Notes (Signed)
CSN: [id number]     Arrival date & time 08/28/13  1518 History  This chart was scribed for non-physician practitioner, Junious SilkHannah Deavion Strider, PA-C working with Shon Batonourtney F Horton, MD by Greggory StallionKayla Andersen, ED scribe. This patient was seen in room WTR5/WTR5 and the patient's care was started at 4:34 PM.   Chief Complaint  Patient presents with  . Mass   The history is provided by the patient. No language interpreter was used.   HPI Comments: Lauren Collins is a 23 y.o. female who presents to the Emergency Department complaining of a lump on her chest wall between her breasts that started 8-9 days. She states it has gotten larger but denies any pain. Pt thought it was an abscess and 'messed' with it. She has taken tylenol with no relief. There has been no drainage from the area. Denies nausea, emesis, trouble breathing, generalized body aches.   History reviewed. No pertinent past medical history. History reviewed. No pertinent past surgical history. History reviewed. No pertinent family history. History  Substance Use Topics  . Smoking status: Never Smoker   . Smokeless tobacco: Not on file  . Alcohol Use: No   OB History   Grav Para Term Preterm Abortions TAB SAB Ect Mult Living                 Review of Systems  Gastrointestinal: Negative for nausea and vomiting.  Musculoskeletal: Negative for myalgias.  Skin: Positive for color change.       Lump on chest wall.  All other systems reviewed and are negative.  Allergies  Review of patient's allergies indicates no known allergies.  Home Medications   Prior to Admission medications   Medication Sig Start Date End Date Taking? Authorizing Provider  medroxyPROGESTERone (DEPO-PROVERA) 150 MG/ML injection Inject 150 mg into the muscle every 3 (three) months.    Historical Provider, MD  oxyCODONE-acetaminophen (PERCOCET/ROXICET) 5-325 MG per tablet Take 1 tablet by mouth every 4 (four) hours as needed for pain. 05/04/12   Flint MelterElliott L Wentz, MD   BP  121/52  Pulse 80  Temp(Src) 99.8 F (37.7 C) (Oral)  Resp 16  SpO2 100%  Physical Exam  Nursing note and vitals reviewed. Constitutional: She is oriented to person, place, and time. She appears well-developed and well-nourished. No distress.  HENT:  Head: Normocephalic and atraumatic.  Right Ear: External ear normal.  Left Ear: External ear normal.  Nose: Nose normal.  Mouth/Throat: Oropharynx is clear and moist.  Eyes: Conjunctivae are normal.  Neck: Normal range of motion.  Cardiovascular: Normal rate, regular rhythm and normal heart sounds.   Pulmonary/Chest: Effort normal and breath sounds normal. No stridor. No respiratory distress. She has no wheezes. She has no rales.  3 cm area of induration with surrounding erythema. No streaking. Warmth to palpation. No drainable abscess.   Abdominal: Soft. She exhibits no distension.  Musculoskeletal: Normal range of motion.  Neurological: She is alert and oriented to person, place, and time. She has normal strength.  Skin: Skin is warm and dry. She is not diaphoretic. No erythema.  Psychiatric: She has a normal mood and affect. Her behavior is normal.    ED Course  Procedures (including critical care time)  DIAGNOSTIC STUDIES: Oxygen Saturation is 100% on RA, normal by my interpretation.    COORDINATION OF CARE: 4:38 PM-Discussed treatment plan which includes keflex and bactrim with pt at bedside and pt agreed to plan. Return precautions given.  Labs Review Labs Reviewed - No data  to display  Imaging Review No results found.   EKG Interpretation None      MDM   Final diagnoses:  Cellulitis    Suspect uncomplicated cellulitis based on limited area of involvement, minimal pain, no systemic signs of illness (eg, fever, chills, dehydration, altered mental status, tachypnea, tachycardia, hypotension), no risk factors for serious illness (eg, extremes of age, general debility, immunocompromised status).   PE reveals  redness, swelling, mildly tender, warm to touch. Skin intact, No bleeding. No bullae. Non purulent. Non circumferential.  Borders are not elevated or sharply demarcated. Drew a line around the area of infection. Pt was instructed to return to the ED if area surpasses the boarder or pain intensifies.   Will prescribed Bactrim to cover for MRSA, direct pt to apply warm compresses and to return to ED for I&D if pain should increase or abscess should develop.    I personally performed the services described in this documentation, which was scribed in my presence. The recorded information has been reviewed and is accurate.  Mora BellmanHannah S Babara Buffalo, PA-C 08/29/13 [phone]

## 2013-08-30 NOTE — ED Provider Notes (Signed)
Medical screening examination/treatment/procedure(s) were performed by non-physician practitioner and as supervising physician I was immediately available for consultation/collaboration.   EKG Interpretation None       Courtney F Horton, MD 08/30/13 0117 

## 2014-12-02 ENCOUNTER — Inpatient Hospital Stay (HOSPITAL_COMMUNITY)
Admission: AD | Admit: 2014-12-02 | Discharge: 2014-12-03 | Disposition: A | Discharge status: Home / Self Care | Payer: 59 | Source: Ambulatory Visit | Attending: Obstetrics and Gynecology | Admitting: Obstetrics and Gynecology

## 2014-12-02 DIAGNOSIS — N760 Acute vaginitis: Secondary | ICD-10-CM | POA: Diagnosis not present

## 2014-12-02 DIAGNOSIS — B9689 Other specified bacterial agents as the cause of diseases classified elsewhere: Secondary | ICD-10-CM

## 2014-12-02 DIAGNOSIS — Z3A01 Less than 8 weeks gestation of pregnancy: Secondary | ICD-10-CM | POA: Diagnosis not present

## 2014-12-02 DIAGNOSIS — R109 Unspecified abdominal pain: Secondary | ICD-10-CM

## 2014-12-02 DIAGNOSIS — O23591 Infection of other part of genital tract in pregnancy, first trimester: Secondary | ICD-10-CM | POA: Diagnosis not present

## 2014-12-02 DIAGNOSIS — O30041 Twin pregnancy, dichorionic/diamniotic, first trimester: Secondary | ICD-10-CM | POA: Diagnosis not present

## 2014-12-02 DIAGNOSIS — O26899 Other specified pregnancy related conditions, unspecified trimester: Secondary | ICD-10-CM

## 2014-12-02 DIAGNOSIS — O9989 Other specified diseases and conditions complicating pregnancy, childbirth and the puerperium: Secondary | ICD-10-CM | POA: Diagnosis not present

## 2014-12-02 NOTE — MAU Note (Signed)
Pt reports she is having cramping and nausea. Reports she has a loose stool every time she eats.

## 2014-12-03 ENCOUNTER — Encounter (HOSPITAL_COMMUNITY): Payer: Self-pay | Admitting: *Deleted

## 2014-12-03 ENCOUNTER — Inpatient Hospital Stay (HOSPITAL_COMMUNITY): Payer: 59

## 2014-12-03 DIAGNOSIS — R109 Unspecified abdominal pain: Secondary | ICD-10-CM | POA: Diagnosis not present

## 2014-12-03 DIAGNOSIS — N760 Acute vaginitis: Secondary | ICD-10-CM

## 2014-12-03 DIAGNOSIS — B9689 Other specified bacterial agents as the cause of diseases classified elsewhere: Secondary | ICD-10-CM

## 2014-12-03 DIAGNOSIS — O23591 Infection of other part of genital tract in pregnancy, first trimester: Secondary | ICD-10-CM

## 2014-12-03 DIAGNOSIS — O9989 Other specified diseases and conditions complicating pregnancy, childbirth and the puerperium: Secondary | ICD-10-CM

## 2014-12-03 LAB — URINALYSIS, ROUTINE W REFLEX MICROSCOPIC
Bilirubin Urine: NEGATIVE
Glucose, UA: NEGATIVE mg/dL
HGB URINE DIPSTICK: NEGATIVE
KETONES UR: NEGATIVE mg/dL
LEUKOCYTES UA: NEGATIVE
NITRITE: NEGATIVE
PROTEIN: NEGATIVE mg/dL
Specific Gravity, Urine: >1.03 — ABNORMAL HIGH (ref 1.005–1.030)
UROBILINOGEN UA: 0.2 mg/dL (ref 0.0–1.0)
pH: 6 (ref 5.0–8.0)

## 2014-12-03 LAB — GC/CHLAMYDIA PROBE AMP (~~LOC~~) NOT AT ARMC
CHLAMYDIA, DNA PROBE: NEGATIVE
NEISSERIA GONORRHEA: NEGATIVE

## 2014-12-03 LAB — CBC
HCT: 36.5 % (ref 36.0–46.0)
Hemoglobin: 11.9 g/dL — ABNORMAL LOW (ref 12.0–15.0)
MCH: 25.5 pg — AB (ref 26.0–34.0)
MCHC: 32.6 g/dL (ref 30.0–36.0)
MCV: 78.2 fL (ref 78.0–100.0)
PLATELETS: 326 10*3/uL (ref 150–400)
RBC: 4.67 MIL/uL (ref 3.87–5.11)
RDW: 14.7 % (ref 11.5–15.5)
WBC: 8.3 10*3/uL (ref 4.0–10.5)

## 2014-12-03 LAB — WET PREP, GENITAL
TRICH WET PREP: NONE SEEN
YEAST WET PREP: NONE SEEN

## 2014-12-03 LAB — HCG, QUANTITATIVE, PREGNANCY: HCG, BETA CHAIN, QUANT, S: 25293 m[IU]/mL — AB (ref <5)

## 2014-12-03 LAB — HIV ANTIBODY (ROUTINE TESTING W REFLEX): HIV SCREEN 4TH GENERATION: NONREACTIVE

## 2014-12-03 LAB — POCT PREGNANCY, URINE: Preg Test, Ur: POSITIVE — AB

## 2014-12-03 MED ORDER — METRONIDAZOLE 500 MG PO TABS: 500.0000 mg | ORAL_TABLET | Freq: Two times a day (BID) | ORAL | Status: DC

## 2014-12-03 NOTE — MAU Provider Note (Signed)
History     CSN: [id number]  Arrival date and time: 12/02/14 2321   First Provider Initiated Contact with Patient 12/03/14 [phone]      Chief Complaint  Patient presents with  . Abdominal Pain   HPI Ms. Lauren Collins is a 24 y.o. G3P0010 at [redacted]w[redacted]d here with report of abdominal cramping x 2 weeks and nausea for approximately one month.  Also reports browning vaginal discharge x 1 week with odor.  Denies vaginal bleeding.    Past Medical History  Diagnosis Date  . Chronic kidney disease     stones    Past Surgical History  Procedure Laterality Date  . No past surgeries      History reviewed. No pertinent family history.  History  Substance Use Topics  . Smoking status: Never Smoker   . Smokeless tobacco: Not on file  . Alcohol Use: No    Allergies: No Known Allergies  No prescriptions prior to admission    Review of Systems  Constitutional: Negative for fever and chills.  Gastrointestinal: Positive for nausea, vomiting and abdominal pain (cramping). Negative for diarrhea and constipation.  Genitourinary: Negative for dysuria, urgency and frequency.  All other systems reviewed and are negative.  Physical Exam   Blood pressure 100/70, pulse 69, temperature 99 F (37.2 C), temperature source Oral, resp. rate 16, height 5' (1.524 m), weight 87.091 kg (192 lb), last menstrual period 10/23/2014, SpO2 100 %.  Physical Exam  Constitutional: She is oriented to person, place, and time. She appears well-developed and well-nourished. No distress.  HENT:  Head: Normocephalic.  Neck: Normal range of motion. Neck supple.  Cardiovascular: Normal rate, regular rhythm and normal heart sounds.  Exam reveals no gallop and no friction rub.   No murmur heard. Respiratory: Effort normal and breath sounds normal. No respiratory distress.  GI: Soft. There is no tenderness. There is no CVA tenderness.  Genitourinary: Uterus is enlarged. Cervix exhibits no motion tenderness and no  discharge. Vaginal discharge (white, creamy) found.  Musculoskeletal: Normal range of motion.  Neurological: She is alert and oriented to person, place, and time.  Skin: Skin is warm and dry.  Psychiatric: She has a normal mood and affect.    MAU Course  Procedures  Results for orders placed or performed during the hospital encounter of 12/02/14 (from the past 24 hour(s))  Urinalysis, Routine w reflex microscopic (not at [facility])     Status: Abnormal   Collection Time: 12/02/14 11:50 PM  Result Value Ref Range   Color, Urine YELLOW YELLOW   APPearance CLEAR CLEAR   Specific Gravity, Urine >1.030 (H) 1.005 - 1.030   pH 6.0 5.0 - 8.0   Glucose, UA NEGATIVE NEGATIVE mg/dL   Hgb urine dipstick NEGATIVE NEGATIVE   Bilirubin Urine NEGATIVE NEGATIVE   Ketones, ur NEGATIVE NEGATIVE mg/dL   Protein, ur NEGATIVE NEGATIVE mg/dL   Urobilinogen, UA 0.2 0.0 - 1.0 mg/dL   Nitrite NEGATIVE NEGATIVE   Leukocytes, UA NEGATIVE NEGATIVE  Pregnancy, urine POC     Status: Abnormal   Collection Time: 12/03/14 12:45 AM  Result Value Ref Range   Preg Test, Ur POSITIVE (A) NEGATIVE  Wet prep, genital     Status: Abnormal   Collection Time: 12/03/14 12:55 AM  Result Value Ref Range   Yeast Wet Prep HPF POC NONE SEEN NONE SEEN   Trich, Wet Prep NONE SEEN NONE SEEN   Clue Cells Wet Prep HPF POC FEW (A) NONE SEEN   WBC, Wet  Prep HPF POC FEW (A) NONE SEEN  CBC     Status: Abnormal   Collection Time: 12/03/14  1:10 AM  Result Value Ref Range   WBC 8.3 4.0 - 10.5 K/uL   RBC 4.67 3.87 - 5.11 MIL/uL   Hemoglobin 11.9 (L) 12.0 - 15.0 g/dL   HCT 78.2 95.6 - 21.3 %   MCV 78.2 78.0 - 100.0 fL   MCH 25.5 (L) 26.0 - 34.0 pg   MCHC 32.6 30.0 - 36.0 g/dL   RDW 08.6 57.8 - 46.9 %   Platelets 326 150 - 400 K/uL    Ultrasound: IMPRESSION: Two intrauterine gestational sacs noted, both with yolk sacs. No embryos are yet seen. The mean sac diameter of 1.1 cm for both gestational sacs corresponds to gestational  ages of 5 weeks 6 days, which match the gestational age by LMP, reflecting an estimated date of delivery of July 30, 2015. The pregnancy is diamniotic dichorionic in nature. Assessment and Plan  24 y.o. G3P0020 at [redacted]w[redacted]d IUP Bacterial Vaginosis Di/Di Gestation  Plan: Discharge to home Begin prenatal care RX Flagyl Pregnancy precautions reviewed Prenatal vitamins  KARIM, Cliffard Hair N 12/03/2014, 3:24 AM

## 2014-12-18 ENCOUNTER — Other Ambulatory Visit: Payer: Self-pay | Admitting: Obstetrics & Gynecology

## 2014-12-18 LAB — OB RESULTS CONSOLE ABO/RH: RH TYPE: POSITIVE

## 2014-12-18 LAB — OB RESULTS CONSOLE RUBELLA ANTIBODY, IGM: Rubella: IMMUNE

## 2014-12-18 LAB — OB RESULTS CONSOLE ANTIBODY SCREEN: Antibody Screen: NEGATIVE

## 2014-12-18 LAB — OB RESULTS CONSOLE HIV ANTIBODY (ROUTINE TESTING): HIV: NONREACTIVE

## 2014-12-18 LAB — OB RESULTS CONSOLE GC/CHLAMYDIA
CHLAMYDIA, DNA PROBE: NEGATIVE
Gonorrhea: NEGATIVE

## 2014-12-18 LAB — OB RESULTS CONSOLE HEPATITIS B SURFACE ANTIGEN: Hepatitis B Surface Ag: NEGATIVE

## 2014-12-18 LAB — OB RESULTS CONSOLE RPR: RPR: NONREACTIVE

## 2014-12-20 LAB — CYTOLOGY - PAP

## 2015-01-06 ENCOUNTER — Encounter (HOSPITAL_COMMUNITY): Payer: Self-pay | Admitting: *Deleted

## 2015-01-06 ENCOUNTER — Inpatient Hospital Stay (HOSPITAL_COMMUNITY)
Admission: AD | Admit: 2015-01-06 | Discharge: 2015-01-06 | Disposition: A | Discharge status: Home / Self Care | Payer: 59 | Source: Ambulatory Visit | Attending: Obstetrics and Gynecology | Admitting: Obstetrics and Gynecology

## 2015-01-06 DIAGNOSIS — R109 Unspecified abdominal pain: Secondary | ICD-10-CM | POA: Insufficient documentation

## 2015-01-06 DIAGNOSIS — Z3A10 10 weeks gestation of pregnancy: Secondary | ICD-10-CM | POA: Diagnosis not present

## 2015-01-06 DIAGNOSIS — O30001 Twin pregnancy, unspecified number of placenta and unspecified number of amniotic sacs, first trimester: Secondary | ICD-10-CM | POA: Diagnosis not present

## 2015-01-06 DIAGNOSIS — O26899 Other specified pregnancy related conditions, unspecified trimester: Secondary | ICD-10-CM

## 2015-01-06 DIAGNOSIS — O9989 Other specified diseases and conditions complicating pregnancy, childbirth and the puerperium: Secondary | ICD-10-CM | POA: Diagnosis not present

## 2015-01-06 DIAGNOSIS — O26893 Other specified pregnancy related conditions, third trimester: Secondary | ICD-10-CM | POA: Insufficient documentation

## 2015-01-06 HISTORY — DX: Anemia, unspecified: D64.9

## 2015-01-06 LAB — URINALYSIS, ROUTINE W REFLEX MICROSCOPIC
BILIRUBIN URINE: NEGATIVE
Glucose, UA: NEGATIVE mg/dL
HGB URINE DIPSTICK: NEGATIVE
Ketones, ur: NEGATIVE mg/dL
Leukocytes, UA: NEGATIVE
NITRITE: NEGATIVE
PH: 6 (ref 5.0–8.0)
Protein, ur: NEGATIVE mg/dL
Urobilinogen, UA: 0.2 mg/dL (ref 0.0–1.0)

## 2015-01-06 NOTE — MAU Note (Signed)
Was treated in August, was unable to take the pills, cream did not help at all

## 2015-01-06 NOTE — MAU Note (Signed)
Yesterday she was having sever back pains (10) and RLQ pain.  The pain has stopped, but she just needed to come in and make sure everything is ok.

## 2015-01-06 NOTE — Discharge Instructions (Signed)

## 2015-01-06 NOTE — MAU Provider Note (Signed)
Chief Complaint: Abdominal Pain and Back Pain   First Provider Initiated Contact with Patient 01/06/15 1907      SUBJECTIVE HPI: Lauren Collins is a 24 y.o. G3P0020 at [redacted]w[redacted]d by LMP who presents to maternity admissions reporting sharp abdominal pain in her lower abdomen and pain in her low back yesterday.  She denies pain today.  She reports since she has a twins pregnancy, she just wanted to come get checked out to be on the safe side. She denies vaginal bleeding, vaginal itching/burning, urinary symptoms, h/a, dizziness, n/v, or fever/chills.     Abdominal Pain This is a new problem. The current episode started yesterday. The onset quality is gradual. The problem occurs intermittently. The most recent episode lasted 24 hours. The problem has been resolved. The pain is located in the LLQ and RLQ. The pain is moderate. The quality of the pain is sharp. The abdominal pain radiates to the back. Pertinent negatives include no constipation, diarrhea, dysuria, fever, frequency, headaches, nausea or vomiting. She has tried nothing for the symptoms.  Back Pain Associated symptoms include abdominal pain. Pertinent negatives include no chest pain, dysuria, fever, headaches, pelvic pain or weakness.    Past Medical History  Diagnosis Date  . Chronic kidney disease     stones  . Anemia    Past Surgical History  Procedure Laterality Date  . No past surgeries     Social History   Social History  . Marital Status: Single    Spouse Name: N/A  . Number of Children: N/A  . Years of Education: N/A   Occupational History  . Not on file.   Social History Main Topics  . Smoking status: Never Smoker   . Smokeless tobacco: Not on file  . Alcohol Use: No  . Drug Use: No  . Sexual Activity: Yes    Birth Control/ Protection: None   Other Topics Concern  . Not on file   Social History Narrative   No current facility-administered medications on file prior to encounter.   Current Outpatient  Prescriptions on File Prior to Encounter  Medication Sig Dispense Refill  . metroNIDAZOLE (FLAGYL) 500 MG tablet Take 1 tablet (500 mg total) by mouth 2 (two) times daily. (Patient not taking: Reported on 01/06/2015) 14 tablet 0   No Known Allergies  ROS:  Review of Systems  Constitutional: Negative for fever, chills and fatigue.  HENT: Negative for sinus pressure.   Eyes: Negative for photophobia.  Respiratory: Negative for shortness of breath.   Cardiovascular: Negative for chest pain.  Gastrointestinal: Positive for abdominal pain. Negative for nausea, vomiting, diarrhea and constipation.  Genitourinary: Negative for dysuria, frequency, flank pain, vaginal bleeding, vaginal discharge, difficulty urinating, vaginal pain and pelvic pain.  Musculoskeletal: Positive for back pain. Negative for neck pain.  Neurological: Negative for dizziness, weakness and headaches.  Psychiatric/Behavioral: Negative.      I have reviewed patient's Past Medical Hx, Surgical Hx, Family Hx, Social Hx, medications and allergies.   Physical Exam  Patient Vitals for the past 24 hrs:  BP Temp Temp src Pulse Resp Weight  01/06/15 1742 116/62 mmHg 98.6 F (37 C) Oral 85 18 87.363 kg (192 lb 9.6 oz)   Constitutional: Well-developed, well-nourished female in no acute distress.  Cardiovascular: normal rate Respiratory: normal effort GI: Abd soft, non-tender. Pos BS x 4 MS: Extremities nontender, no edema, normal ROM Neurologic: Alert and oriented x 4.  GU: Neg CVAT.   FHT 157 and 162 by doppler  LAB RESULTS Results for orders placed or performed during the hospital encounter of 01/06/15 (from the past 24 hour(s))  Urinalysis, Routine w reflex microscopic (not at [facility])     Status: Abnormal   Collection Time: 01/06/15  5:45 PM  Result Value Ref Range   Color, Urine YELLOW YELLOW   APPearance CLEAR CLEAR   Specific Gravity, Urine >1.030 (H) 1.005 - 1.030   pH 6.0 5.0 - 8.0   Glucose, UA NEGATIVE  NEGATIVE mg/dL   Hgb urine dipstick NEGATIVE NEGATIVE   Bilirubin Urine NEGATIVE NEGATIVE   Ketones, ur NEGATIVE NEGATIVE mg/dL   Protein, ur NEGATIVE NEGATIVE mg/dL   Urobilinogen, UA 0.2 0.0 - 1.0 mg/dL   Nitrite NEGATIVE NEGATIVE   Leukocytes, UA NEGATIVE NEGATIVE       IMAGING No results found.  MAU Management/MDM: Ordered labs and reviewed results.  Consult Dr Henderson Cloud, reviewed assessment and labs.  Reassurance provided to pt.  Pt stable at time of discharge.  ASSESSMENT 1. Abdominal pain affecting pregnancy   2. Twin gestation in first trimester, unspecified placenta and amniotic sac number     PLAN Discharge home      Follow-up Information    Call Jelicia Nantz A, MD.   Specialty:  Obstetrics and Gynecology   Why:  If symptoms persist or worsen   Contact information:   [address] [place] [address] [phone]       Follow up with THE [facility] [place] MATERNITY ADMISSIONS.   Why:  As needed for emergencies   Contact information:   [address] [id number] mc [place] [address] [phone]      Sharen Counter Certified Nurse-Midwife 01/06/2015  7:15 PM

## 2015-01-17 ENCOUNTER — Other Ambulatory Visit: Payer: Self-pay | Admitting: Obstetrics and Gynecology

## 2015-01-21 LAB — CYTOLOGY - PAP

## 2015-03-12 ENCOUNTER — Other Ambulatory Visit: Payer: Self-pay | Admitting: Obstetrics and Gynecology

## 2015-04-10 ENCOUNTER — Other Ambulatory Visit: Payer: Self-pay | Admitting: Obstetrics and Gynecology

## 2015-04-10 DIAGNOSIS — N6325 Unspecified lump in the left breast, overlapping quadrants: Secondary | ICD-10-CM

## 2015-04-10 DIAGNOSIS — N6320 Unspecified lump in the left breast, unspecified quadrant: Principal | ICD-10-CM

## 2015-04-12 ENCOUNTER — Ambulatory Visit
Admission: RE | Admit: 2015-04-12 | Discharge: 2015-04-12 | Disposition: A | Discharge status: Home / Self Care | Payer: 59 | Source: Ambulatory Visit | Attending: Obstetrics and Gynecology | Admitting: Obstetrics and Gynecology

## 2015-04-12 DIAGNOSIS — N6320 Unspecified lump in the left breast, unspecified quadrant: Principal | ICD-10-CM

## 2015-04-12 DIAGNOSIS — N6325 Unspecified lump in the left breast, overlapping quadrants: Secondary | ICD-10-CM

## 2015-05-09 ENCOUNTER — Encounter (HOSPITAL_COMMUNITY): Payer: Self-pay | Admitting: *Deleted

## 2015-05-09 ENCOUNTER — Inpatient Hospital Stay (HOSPITAL_COMMUNITY): Payer: 59

## 2015-05-09 ENCOUNTER — Observation Stay (HOSPITAL_COMMUNITY)
Admission: AD | Admit: 2015-05-09 | Discharge: 2015-05-10 | Disposition: A | Discharge status: Home / Self Care | Payer: 59 | Source: Ambulatory Visit | Attending: Obstetrics | Admitting: Obstetrics

## 2015-05-09 DIAGNOSIS — N760 Acute vaginitis: Secondary | ICD-10-CM

## 2015-05-09 DIAGNOSIS — O23593 Infection of other part of genital tract in pregnancy, third trimester: Principal | ICD-10-CM | POA: Insufficient documentation

## 2015-05-09 DIAGNOSIS — O09893 Supervision of other high risk pregnancies, third trimester: Secondary | ICD-10-CM

## 2015-05-09 DIAGNOSIS — Z3A28 28 weeks gestation of pregnancy: Secondary | ICD-10-CM | POA: Diagnosis not present

## 2015-05-09 DIAGNOSIS — B9689 Other specified bacterial agents as the cause of diseases classified elsewhere: Secondary | ICD-10-CM

## 2015-05-09 DIAGNOSIS — Z87442 Personal history of urinary calculi: Secondary | ICD-10-CM | POA: Diagnosis not present

## 2015-05-09 DIAGNOSIS — O09213 Supervision of pregnancy with history of pre-term labor, third trimester: Secondary | ICD-10-CM

## 2015-05-09 DIAGNOSIS — O30043 Twin pregnancy, dichorionic/diamniotic, third trimester: Secondary | ICD-10-CM | POA: Diagnosis not present

## 2015-05-09 DIAGNOSIS — O24410 Gestational diabetes mellitus in pregnancy, diet controlled: Secondary | ICD-10-CM | POA: Diagnosis not present

## 2015-05-09 DIAGNOSIS — O4700 False labor before 37 completed weeks of gestation, unspecified trimester: Secondary | ICD-10-CM | POA: Diagnosis present

## 2015-05-09 DIAGNOSIS — Z862 Personal history of diseases of the blood and blood-forming organs and certain disorders involving the immune mechanism: Secondary | ICD-10-CM | POA: Diagnosis not present

## 2015-05-09 DIAGNOSIS — O368390 Maternal care for abnormalities of the fetal heart rate or rhythm, unspecified trimester, not applicable or unspecified: Secondary | ICD-10-CM

## 2015-05-09 DIAGNOSIS — O4693 Antepartum hemorrhage, unspecified, third trimester: Secondary | ICD-10-CM | POA: Diagnosis present

## 2015-05-09 HISTORY — DX: Calculus of kidney: N20.0

## 2015-05-09 HISTORY — DX: Gestational diabetes mellitus in pregnancy, unspecified control: O24.419

## 2015-05-09 LAB — URINALYSIS, ROUTINE W REFLEX MICROSCOPIC
Bilirubin Urine: NEGATIVE
GLUCOSE, UA: NEGATIVE mg/dL
HGB URINE DIPSTICK: NEGATIVE
Ketones, ur: 15 mg/dL — AB
Leukocytes, UA: NEGATIVE
Nitrite: NEGATIVE
Protein, ur: NEGATIVE mg/dL
Specific Gravity, Urine: <1.005 — ABNORMAL LOW (ref 1.005–1.030)
pH: 6 (ref 5.0–8.0)

## 2015-05-09 LAB — WET PREP, GENITAL
Sperm: NONE SEEN
Trich, Wet Prep: NONE SEEN
Yeast Wet Prep HPF POC: NONE SEEN

## 2015-05-09 LAB — GLUCOSE, CAPILLARY: Glucose-Capillary: 127 mg/dL — ABNORMAL HIGH (ref 65–99)

## 2015-05-09 LAB — TYPE AND SCREEN
ABO/RH(D): O POS
ANTIBODY SCREEN: NEGATIVE

## 2015-05-09 LAB — ABO/RH: ABO/RH(D): O POS

## 2015-05-09 MED ORDER — ZOLPIDEM TARTRATE 5 MG PO TABS: 5.0000 mg | ORAL_TABLET | Freq: Every evening | ORAL | Status: DC | PRN

## 2015-05-09 MED ORDER — LACTATED RINGERS IV BOLUS (SEPSIS)
1000.0000 mL | Freq: Once | INTRAVENOUS | Status: AC
  Administered 2015-05-09: 1000 mL via INTRAVENOUS

## 2015-05-09 MED ORDER — ACETAMINOPHEN 325 MG PO TABS: 650.0000 mg | ORAL_TABLET | ORAL | Status: DC | PRN

## 2015-05-09 MED ORDER — PRENATAL MULTIVITAMIN CH
1.0000 | ORAL_TABLET | Freq: Every day | ORAL | Status: DC
  Administered 2015-05-10: 1 via ORAL
  Filled 2015-05-09: qty 1

## 2015-05-09 MED ORDER — CALCIUM CARBONATE ANTACID 500 MG PO CHEW: 2.0000 | CHEWABLE_TABLET | ORAL | Status: DC | PRN

## 2015-05-09 MED ORDER — BETAMETHASONE SOD PHOS & ACET 6 (3-3) MG/ML IJ SUSP
12.0000 mg | INTRAMUSCULAR | Status: AC
  Administered 2015-05-09 – 2015-05-10 (×2): 12 mg via INTRAMUSCULAR
  Filled 2015-05-09 (×2): qty 2

## 2015-05-09 MED ORDER — FERROUS SULFATE 325 (65 FE) MG PO TABS
325.0000 mg | ORAL_TABLET | Freq: Two times a day (BID) | ORAL | Status: DC
  Administered 2015-05-10 (×2): 325 mg via ORAL
  Filled 2015-05-09 (×2): qty 1

## 2015-05-09 MED ORDER — METRONIDAZOLE 500 MG PO TABS
500.0000 mg | ORAL_TABLET | Freq: Two times a day (BID) | ORAL | Status: DC
  Administered 2015-05-09 – 2015-05-10 (×2): 500 mg via ORAL
  Filled 2015-05-09 (×4): qty 1

## 2015-05-09 MED ORDER — MAGNESIUM SULFATE BOLUS VIA INFUSION
4.0000 g | Freq: Once | INTRAVENOUS | Status: AC
  Administered 2015-05-09: 4 g via INTRAVENOUS
  Filled 2015-05-09: qty 500

## 2015-05-09 MED ORDER — DOCUSATE SODIUM 100 MG PO CAPS
100.0000 mg | ORAL_CAPSULE | Freq: Every day | ORAL | Status: DC
  Administered 2015-05-10: 100 mg via ORAL
  Filled 2015-05-09: qty 1

## 2015-05-09 MED ORDER — MAGNESIUM SULFATE 50 % IJ SOLN
2.0000 g/h | INTRAVENOUS | Status: DC
  Filled 2015-05-09: qty 80

## 2015-05-09 NOTE — MAU Note (Signed)
Pt presents to MAU with complaints of vaginal bleeding that started 30 minutes ago. Reports mild abdominal cramping

## 2015-05-09 NOTE — MAU Provider Note (Signed)
History     CSN: [id number]  Arrival date and time: 05/09/15 1426   First Provider Initiated Contact with Patient 05/09/15 1545      Chief Complaint  Patient presents with  . Vaginal Bleeding   HPI   Ms.Lauren Collins is a 25 y.o. female G3P0020 at [redacted]w[redacted]d presenting to MAU with vaginal bleeding. The patient was using the bathroom to urinate about an hour ago and she noted bright red spotting on the tissue. This is the first time she has ever had spotting in the pregnancy.   She reports dime size blood on her pad and reports that the blood covered her pad.   She also complains of mild contraction like pain that she has been experiencing for a while now. The pain comes and goes and when it comes she rates the pain 8/10. No recent intercourse.   + fetal movement.   OB History    Gravida Para Term Preterm AB TAB SAB Ectopic Multiple Living   3    2 1 1    0      Past Medical History  Diagnosis Date  . Chronic kidney disease     stones  . Anemia   . Gestational diabetes     Past Surgical History  Procedure Laterality Date  . No past surgeries      History reviewed. No pertinent family history.  Social History  Substance Use Topics  . Smoking status: Never Smoker   . Smokeless tobacco: None  . Alcohol Use: No    Allergies: No Known Allergies  Prescriptions prior to admission  Medication Sig Dispense Refill Last Dose  . ferrous sulfate 325 (65 FE) MG tablet Take 325 mg by mouth 2 (two) times daily.   Past Week at Unknown time  . Prenatal Vit-Min-FA-Fish Oil ([employer] PRENATAL GUMMY PO) Take 2 each by mouth daily.   Past Week at Unknown time   Results for orders placed or performed during the hospital encounter of 05/09/15 (from the past 48 hour(s))  Urinalysis, Routine w reflex microscopic (not at [facility])     Status: Abnormal   Collection Time: 05/09/15  3:34 PM  Result Value Ref Range   Color, Urine YELLOW YELLOW   APPearance CLEAR CLEAR   Specific Gravity, Urine  <1.005 (L) 1.005 - 1.030   pH 6.0 5.0 - 8.0   Glucose, UA NEGATIVE NEGATIVE mg/dL   Hgb urine dipstick NEGATIVE NEGATIVE   Bilirubin Urine NEGATIVE NEGATIVE   Ketones, ur 15 (A) NEGATIVE mg/dL   Protein, ur NEGATIVE NEGATIVE mg/dL   Nitrite NEGATIVE NEGATIVE   Leukocytes, UA NEGATIVE NEGATIVE    Comment: MICROSCOPIC NOT DONE ON URINES WITH NEGATIVE PROTEIN, BLOOD, LEUKOCYTES, NITRITE, OR GLUCOSE <1000 mg/dL.  Wet prep, genital     Status: Abnormal   Collection Time: 05/09/15  4:00 PM  Result Value Ref Range   Yeast Wet Prep HPF POC NONE SEEN NONE SEEN   Trich, Wet Prep NONE SEEN NONE SEEN   Clue Cells Wet Prep HPF POC PRESENT (A) NONE SEEN   WBC, Wet Prep HPF POC MODERATE (A) NONE SEEN    Comment: MANY BACTERIA SEEN   Sperm NONE SEEN   ABO/Rh     Status: None (Preliminary result)   Collection Time: 05/09/15  5:09 PM  Result Value Ref Range   ABO/RH(D) O POS     Review of Systems  Constitutional: Negative for fever and chills.  Gastrointestinal: Negative for nausea, vomiting and constipation.  Physical Exam   Blood pressure 125/67, pulse 103, temperature 98 F (36.7 C), resp. rate 18, last menstrual period 10/23/2014.  Physical Exam  Constitutional: She is oriented to person, place, and time. She appears well-developed and well-nourished. No distress.  HENT:  Head: Normocephalic.  Eyes: Pupils are equal, round, and reactive to light.  Neck: Neck supple.  Respiratory: Effort normal.  GI: Soft. She exhibits no distension. There is no tenderness. There is no rebound and no guarding.  Genitourinary:  Dilation: Closed Effacement (%): Thick Exam by:: J. Barrie Wale NP  Speculum exam: Vagina - Moderate amount of creamy, thick, white discharge, no odor Cervix - No contact bleeding, cervix appears closed. Bimanual exam: Deferred initially.  Chaperone present for exam.  Musculoskeletal: Normal range of motion.  Neurological: She is alert and oriented to person, place, and time.   Skin: Skin is warm. She is not diaphoretic.  Psychiatric: Her behavior is normal.   Fetal Tracing: A Baseline: 135 bpm Variability: moderate  Accelerations: 15x15 Decelerations: none  Toco: Q2-3 mins apart.   Fetal Tracing: B Baseline: 145 bpm  Variability: moderate  Accelerations: 10x10 and 15x15 Decelerations: quick variables.     MAU Course  Procedures  None  MDM  US, OB limited to evaluate placenta ABO : O positive blood type  1 liter bolus of LR given will monitor and reassess contraction pattern and pain level. Discussed patient with Dr. Chestine Sporelark.   Cervix unchanged with second cervical exam Discussed US findings-report with Dr. Chestine Sporelark.  Contraction pattern unchanged, patient continues to report contraction pain at 8/10   Assessment and Plan   A:  1. Risk of preterm labor, third trimester   2. Variable fetal heart rate decelerations, antepartum   3. Fetal heart rate decelerations affecting management of mother   4. [redacted] weeks gestation of pregnancy   5. Third trimester bleeding   6. Dichorionic diamniotic twin pregnancy in third trimester   7. BV (bacterial vaginosis)    P:  Admit to [facility] per Dr. Chestine Sporelark.  Betamethasone Rapid GBS Magnesium   Flagyl 500 mg BID    Duane LopeJennifer I Danely Bayliss, NP 05/09/2015 7:29 PM

## 2015-05-09 NOTE — H&P (Signed)
25 y.o. [id number] @ 514w2d presents with c/o vaginal bleeding and mild cramping. She noted a dime sized amount of blood w urination. Vaginal exam was unremarkable and SVE was closed and long.  During fetal monitoring in MAU, she noted increasingly painful contractions.  A 1L fluid bolus  Did not alleviate painful contractions. Reports pain is 8/10, though NP notes patient does not appear painful with contractions. Although cervix remained closed/long, given increasingly painful contractions q3 minutes, will admit for observation.  Pregnancy c/b: 1. Di-di twin pregnancy: growth US 12/30 A-vtx-1078g (67%), B breech 975g (56%).  Vtx/breech on US today 2. Anemia--hgb 9.2 at 28 weeks--on PO iron 3. GDM--nutritional consultation is pending given recent diagnosis.   Past Medical History  Diagnosis Date  . Kidney stones   . Anemia   . Gestational diabetes     Past Surgical History  Procedure Laterality Date  . No past surgeries      OB History  Gravida Para Term Preterm AB SAB TAB Ectopic Multiple Living  3    2 1 1    0    # Outcome Date GA Lbr Len/2nd Weight Sex Delivery Anes PTL Lv  3 Current           2 SAB           1 TAB               Social History   Social History  . Marital Status: Single    Spouse Name: N/A  . Number of Children: N/A  . Years of Education: N/A   Occupational History  . Not on file.   Social History Main Topics  . Smoking status: Never Smoker   . Smokeless tobacco: Not on file  . Alcohol Use: No  . Drug Use: No  . Sexual Activity: Yes    Birth Control/ Protection: None   Other Topics Concern  . Not on file   Social History Narrative   Review of patient's allergies indicates no known allergies.    Prenatal Transfer Tool  Maternal Diabetes: Yes:  Diabetes Type:  Diet controlled Genetic Screening: Normal Maternal Ultrasounds/Referrals: Normal Fetal Ultrasounds or other Referrals:  None Maternal Substance Abuse:  No Significant Maternal  Medications:  Meds include: Other:  Significant Maternal Lab Results: Lab values include: Other:    Filed Vitals:   05/09/15 1452  BP: 128/63  Pulse: 97  Temp: 98 F (36.7 C)  Resp: 18     General:  NAD Abdomen:  soft, gravid SVE:  Closed/long per NP, no blood in vagina FHTs:  A--140s, mod var, + accels, no decels.  B--140s, mod var, + accels, no decels Toco:  q3 min  A vtx, bpp 8/10 (-2 breathing), B breech, bpp 8/10 (-2 breathing)   A/P   25 y.o. G3P0020 [redacted]w[redacted]d presents with vaginal bleeding and preterm contractions. Given increasingly painful contractions at 28 weeks, will admit to AP.  BMZ and magnesium now.  Collect gbs.  Will hold ampicillin unless active labor GDM--will check Fasting/2h pp Anemia--po iron BPP 8/10 x 2 (-2 for breathing).  Continuous fetal monitoring while contracting, but fetal status overall reassuring.    [facility]NA GEFFEL [employer]

## 2015-05-10 DIAGNOSIS — O23593 Infection of other part of genital tract in pregnancy, third trimester: Secondary | ICD-10-CM | POA: Diagnosis not present

## 2015-05-10 LAB — CBC
HEMATOCRIT: 29.9 % — AB (ref 36.0–46.0)
HEMOGLOBIN: 9.4 g/dL — AB (ref 12.0–15.0)
MCH: 25.1 pg — AB (ref 26.0–34.0)
MCHC: 31.4 g/dL (ref 30.0–36.0)
MCV: 79.9 fL (ref 78.0–100.0)
Platelets: 274 10*3/uL (ref 150–400)
RBC: 3.74 MIL/uL — ABNORMAL LOW (ref 3.87–5.11)
RDW: 15.3 % (ref 11.5–15.5)
WBC: 10.9 10*3/uL — ABNORMAL HIGH (ref 4.0–10.5)

## 2015-05-10 LAB — GLUCOSE, CAPILLARY
Glucose-Capillary: 108 mg/dL — ABNORMAL HIGH (ref 65–99)
Glucose-Capillary: 116 mg/dL — ABNORMAL HIGH (ref 65–99)

## 2015-05-10 LAB — GC/CHLAMYDIA PROBE AMP (~~LOC~~) NOT AT ARMC
Chlamydia: NEGATIVE
Neisseria Gonorrhea: NEGATIVE

## 2015-05-10 MED ORDER — LACTATED RINGERS IV SOLN
INTRAVENOUS | Status: DC
  Administered 2015-05-09 – 2015-05-10 (×2): via INTRAVENOUS

## 2015-05-10 NOTE — Progress Notes (Addendum)
Patient is comfortable, reports occ. Mild ctx not different from usual ctx she experienced over past week, stronger contractions from last night have resolved.  She denies LOF, VB. k She reports good FM and no other problems  Filed Vitals:   05/10/15 0500 05/10/15 0600 05/10/15 0742 05/10/15 1124  BP: 123/65 140/86 131/65 115/64  Pulse: 96 96 98 105  Temp:   98.3 F (36.8 C) 97.8 F (36.6 C)  TempSrc:      Resp:   16 16  Height:      Weight:      SpO2: 98% 96% 99%     FHT:A 125 mod var 10x10 accels FHT:B 125 mod var 10 x 10 accls SVE:  Closed at admission TOCO: irritable  Lab Results  Component Value Date   WBC 10.9* 05/10/2015   HGB 9.4* 05/10/2015   HCT 29.9* 05/10/2015   MCV 79.9 05/10/2015   PLT 274 05/10/2015    --/--/O POS (01/12 1907)  A/P 28.3 with di/di twins and contractions Ctx have improved.  Discussed with patient plan to continue monitoring today.  Given ctx have resolved will discontinue MgSO4 at this time.  Will recheck cervix this evening and administer second dose of steriods.  If stable will discharge home.  Routine care.   Philip AspenALLAHAN, Varun Jourdan

## 2015-05-10 NOTE — Progress Notes (Signed)
Pt ambulated off unit for discharge with family members at side. No further concerns.

## 2015-05-10 NOTE — Progress Notes (Signed)
Nutrition: Pt provided with copy of "My meal plan for Gestational Diabetes". Additional instruction sheet provided along with examples of snacks. Reviewed briefly the parameters of the GDM diet, CHO counting. [facility].  Elisabeth CaraKatherine [place] M.Odis LusterEd. R.D. LDN Neonatal Nutrition Support Specialist/RD III Pager [phone]      Phone [phone]

## 2015-05-10 NOTE — Progress Notes (Signed)
Pt given discharge instructions. Pt verbalized understanding. No further concerns at this time.

## 2015-05-10 NOTE — Discharge Summary (Signed)
  Pt admitted 05/09/2015 for di/di twin pregnancy and painful contractions.  Pt was started on MgSO4 for neuroprotection and received continuous monitoring.  She also received a course of steroids for FLM.  Contractions resolved and  FHT remained reassuring and she reported good FM x 2.  Cervical exam at discharge was unchanged from admission.  She was advised to f/u in office for next appt and PTL precautions were reviewed.

## 2015-05-11 LAB — CULTURE, BETA STREP (GROUP B ONLY)

## 2015-05-11 LAB — GLUCOSE, CAPILLARY: GLUCOSE-CAPILLARY: 92 mg/dL (ref 65–99)

## 2015-05-15 ENCOUNTER — Encounter: Payer: 59 | Attending: Obstetrics

## 2015-05-15 DIAGNOSIS — O99810 Abnormal glucose complicating pregnancy: Secondary | ICD-10-CM | POA: Insufficient documentation

## 2015-05-22 VITALS — Ht 60.0 in | Wt 205.1 lb

## 2015-05-22 DIAGNOSIS — O24419 Gestational diabetes mellitus in pregnancy, unspecified control: Secondary | ICD-10-CM

## 2015-05-22 DIAGNOSIS — O99810 Abnormal glucose complicating pregnancy: Secondary | ICD-10-CM | POA: Diagnosis not present

## 2015-05-31 NOTE — Progress Notes (Signed)
  Patient was seen on 05/22/15 for Gestational Diabetes self-management . The following learning objectives were met by the patient :   States the definition of Gestational Diabetes  States why dietary management is important in controlling blood glucose  Describes the effects of carbohydrates on blood glucose levels  Demonstrates ability to create a balanced meal plan  Demonstrates carbohydrate counting   States when to check blood glucose levels  Demonstrates proper blood glucose monitoring techniques  States the effect of stress and exercise on blood glucose levels  States the importance of limiting caffeine and abstaining from alcohol and smoking  Plan:  Aim for 2 Carb Choices per meal (30 grams) +/- 1 either way for breakfast Aim for 3 Carb Choices per meal (45 grams) +/- 1 either way from lunch and dinner Aim for 1-2 Carbs per snack Begin reading food labels for Total Carbohydrate and sugar grams of foods Consider  increasing your activity level by walking daily as tolerated Begin checking BG before breakfast and 1-2 hours after first bit of breakfast, lunch and dinner after  as directed by MD  Take medication  as directed by MD  Blood glucose monitor given: One Touch Verio Flex Lot # [id number] X Exp: 01/2016 Blood glucose reading: '69mg'$ /dl  Patient instructed to monitor glucose levels: FBS: 60 - <90 1 hour: <140 2 hour: <120  Patient received the following handouts:  Nutrition Diabetes and Pregnancy  Carbohydrate Counting List  Meal Planning worksheet  Patient will be seen for follow-up as needed.

## 2015-06-12 ENCOUNTER — Other Ambulatory Visit: Payer: Self-pay | Admitting: Obstetrics and Gynecology

## 2015-06-18 ENCOUNTER — Other Ambulatory Visit: Payer: Self-pay | Admitting: Obstetrics and Gynecology

## 2015-06-18 LAB — OB RESULTS CONSOLE GBS: STREP GROUP B AG: NEGATIVE

## 2015-06-27 ENCOUNTER — Institutional Professional Consult (permissible substitution): Payer: Self-pay | Admitting: Pediatrics

## 2015-06-30 ENCOUNTER — Inpatient Hospital Stay (HOSPITAL_COMMUNITY)
Admission: AD | Admit: 2015-06-30 | Discharge: 2015-06-30 | Disposition: A | Discharge status: Home / Self Care | Payer: 59 | Source: Ambulatory Visit | Attending: Obstetrics | Admitting: Obstetrics

## 2015-06-30 ENCOUNTER — Encounter (HOSPITAL_COMMUNITY): Payer: Self-pay | Admitting: *Deleted

## 2015-06-30 DIAGNOSIS — Z3A35 35 weeks gestation of pregnancy: Secondary | ICD-10-CM | POA: Diagnosis not present

## 2015-06-30 DIAGNOSIS — O4703 False labor before 37 completed weeks of gestation, third trimester: Secondary | ICD-10-CM

## 2015-06-30 DIAGNOSIS — Z87442 Personal history of urinary calculi: Secondary | ICD-10-CM | POA: Diagnosis not present

## 2015-06-30 LAB — URINALYSIS, ROUTINE W REFLEX MICROSCOPIC
BILIRUBIN URINE: NEGATIVE
Glucose, UA: NEGATIVE mg/dL
HGB URINE DIPSTICK: NEGATIVE
KETONES UR: 15 mg/dL — AB
Leukocytes, UA: NEGATIVE
NITRITE: NEGATIVE
PH: 6.5 (ref 5.0–8.0)
Protein, ur: NEGATIVE mg/dL
Specific Gravity, Urine: 1.02 (ref 1.005–1.030)

## 2015-06-30 NOTE — MAU Note (Signed)
Pt states she is having contraction in her left lower back that started around 2200 last night.  Pt states they have gotten stronger.  Pt states they have been a constant pain but since arriving at the hospital they have gone away.  Pt states she has not been feeling the babies move this morning.

## 2015-06-30 NOTE — Discharge Instructions (Signed)
Eat healthy foods and snacks. Take Tylenol 325 mg 2 tablets by mouth every 4 hours if needed for pain. Drink at least 8 8-oz glasses of water every day. Return if your contractions worsen, if you have leaking or vaginal bleeding or if the babies are not moving well. Keep your appointment in the office this week.

## 2015-06-30 NOTE — MAU Provider Note (Signed)
History     CSN: [id number]  Arrival date and time: 06/30/15 [id number]   First Provider Initiated Contact with Patient 06/30/15 0957      Chief Complaint  Patient presents with  . Contractions   HPI Lauren Collins 25 y.o. 7670w5d with twin gestation.  Comes to MAU with pain in the left side of her back that awakened her during the night last night.  Having some contractions.  States there has been decreased fetal movement but she has been feeling the babies move since she arrived at the hospital.  Has not taken any medication for her back pain.  Her back pain in constant with occasional sharp shooting pains going down to her left hip.  Denies any leaking or vaginal bleeding.  Has an appointment in the office this week.  OB History    Gravida Para Term Preterm AB TAB SAB Ectopic Multiple Living   3    2 1 1    0      Past Medical History  Diagnosis Date  . Kidney stones   . Anemia   . Gestational diabetes     Past Surgical History  Procedure Laterality Date  . No past surgeries      History reviewed. No pertinent family history.  Social History  Substance Use Topics  . Smoking status: Never Smoker   . Smokeless tobacco: None  . Alcohol Use: No    Allergies: No Known Allergies  Prescriptions prior to admission  Medication Sig Dispense Refill Last Dose  . glyBURIDE (DIABETA) 5 MG tablet Take 5 mg by mouth every morning.  4 06/29/2015 at Unknown time  . OVER THE COUNTER MEDICATION Take 1 tablet by mouth daily. she is taking an over he counter [facility] iron tablet   06/29/2015 at Unknown time  . Prenatal Vit-Fe Fumarate-FA (PRENATAL MULTIVITAMIN) TABS tablet Take 1 tablet by mouth daily at 12 noon.    06/29/2015 at Unknown time    Review of Systems  Constitutional: Negative for fever.  Gastrointestinal: Negative for nausea, vomiting and abdominal pain.       Has noticed some contractions.  Genitourinary:       No vaginal discharge. No vaginal bleeding. No dysuria. Decreased fetal  movement.  Musculoskeletal: Positive for back pain.   Physical Exam   Blood pressure 136/84, pulse 107, temperature 98.1 F (36.7 C), temperature source Oral, resp. rate 20, last menstrual period 10/23/2014.  Physical Exam  Nursing note and vitals reviewed. Constitutional: She is oriented to person, place, and time. She appears well-developed and well-nourished.  HENT:  Head: Normocephalic.  Eyes: EOM are normal.  Neck: Neck supple.  GI: Soft. There is no tenderness.  On fetal monitor.  Irregular contractions 2-12 minutes.  FHT with moderate variability and accels of 15x15 - reactive strip.  Genitourinary:  Cervical exam done.  Internal os is closed.  Cervix is soft but thick.  No bleeding with exam.  Musculoskeletal: Normal range of motion.  No edema in ankles. No CVA tenderness. Able to sit up in bed well with minimal assistance.  Neurological: She is alert and oriented to person, place, and time.  Skin: Skin is warm and dry.  Psychiatric: She has a normal mood and affect.    MAU Course  Procedures Results for orders placed or performed during the hospital encounter of 06/30/15 (from the past 24 hour(s))  Urinalysis, Routine w reflex microscopic (not at [facility])     Status: Abnormal   Collection Time: 06/30/15  9:00 AM  Result Value Ref Range   Color, Urine YELLOW YELLOW   APPearance CLEAR CLEAR   Specific Gravity, Urine 1.020 1.005 - 1.030   pH 6.5 5.0 - 8.0   Glucose, UA NEGATIVE NEGATIVE mg/dL   Hgb urine dipstick NEGATIVE NEGATIVE   Bilirubin Urine NEGATIVE NEGATIVE   Ketones, ur 15 (A) NEGATIVE mg/dL   Protein, ur NEGATIVE NEGATIVE mg/dL   Nitrite NEGATIVE NEGATIVE   Leukocytes, UA NEGATIVE NEGATIVE    MDM Notified Dr. Chestine Spore of client's condition and discussed plan of care.  To go home if cervix is closed. Discussed possible causes of back pain.  Likely is due to late pregnancy with twins and musculoskeletal pain.  Advised Tylenol by mouth by the package  directions if desired.  May want to try Ice pack to her back also.  Discussed the importance of paying attention to the contractions and if the become more regular and are 4 or more per hour or if they become stronger, to return for further evaluation but at this time, her cervix is closed.  Assessment and Plan  Back Pain Threatened preterm labor  Plan Will discharge. Can try ice pack to her back. Keep her appointment this week in the office. Return to MAU if contractions are worsening or if she has vaginal bleeding or leaking of fluid.  Lauren Collins 06/30/2015, 10:21 AM

## 2015-07-01 ENCOUNTER — Telehealth (HOSPITAL_COMMUNITY): Payer: Self-pay | Admitting: *Deleted

## 2015-07-01 ENCOUNTER — Encounter (HOSPITAL_COMMUNITY): Payer: Self-pay | Admitting: *Deleted

## 2015-07-01 NOTE — Telephone Encounter (Signed)
Preadmission screen  

## 2015-07-05 ENCOUNTER — Other Ambulatory Visit (HOSPITAL_COMMUNITY): Payer: Self-pay | Admitting: Obstetrics

## 2015-07-05 DIAGNOSIS — O30043 Twin pregnancy, dichorionic/diamniotic, third trimester: Secondary | ICD-10-CM

## 2015-07-05 DIAGNOSIS — Z3689 Encounter for other specified antenatal screening: Secondary | ICD-10-CM

## 2015-07-05 DIAGNOSIS — O30003 Twin pregnancy, unspecified number of placenta and unspecified number of amniotic sacs, third trimester: Secondary | ICD-10-CM

## 2015-07-05 DIAGNOSIS — O365930 Maternal care for other known or suspected poor fetal growth, third trimester, not applicable or unspecified: Principal | ICD-10-CM

## 2015-07-08 ENCOUNTER — Encounter (HOSPITAL_COMMUNITY): Payer: Self-pay | Admitting: *Deleted

## 2015-07-08 ENCOUNTER — Telehealth (HOSPITAL_COMMUNITY): Payer: Self-pay | Admitting: *Deleted

## 2015-07-08 NOTE — Telephone Encounter (Signed)
Preadmission screen  height 60 inches 220lbs Glyburide once in the morning only

## 2015-07-09 ENCOUNTER — Inpatient Hospital Stay (HOSPITAL_COMMUNITY): Payer: 59 | Admitting: Anesthesiology

## 2015-07-09 ENCOUNTER — Encounter (HOSPITAL_COMMUNITY): Payer: Self-pay | Admitting: *Deleted

## 2015-07-09 ENCOUNTER — Encounter (HOSPITAL_COMMUNITY): Payer: Self-pay

## 2015-07-09 ENCOUNTER — Encounter (HOSPITAL_COMMUNITY)
Admission: AD | Disposition: A | Discharge status: Home / Self Care | Payer: Self-pay | Source: Ambulatory Visit | Attending: Obstetrics and Gynecology

## 2015-07-09 ENCOUNTER — Ambulatory Visit (HOSPITAL_COMMUNITY)
Admission: RE | Admit: 2015-07-09 | Discharge: 2015-07-09 | Disposition: A | Discharge status: Home / Self Care | Payer: 59 | Source: Ambulatory Visit | Attending: Obstetrics | Admitting: Obstetrics

## 2015-07-09 ENCOUNTER — Inpatient Hospital Stay (HOSPITAL_COMMUNITY)
Admission: AD | Admit: 2015-07-09 | Discharge: 2015-07-11 | DRG: 765 | Disposition: A | Discharge status: Home / Self Care | Payer: 59 | Source: Ambulatory Visit | Attending: Obstetrics and Gynecology | Admitting: Obstetrics and Gynecology

## 2015-07-09 DIAGNOSIS — O365930 Maternal care for other known or suspected poor fetal growth, third trimester, not applicable or unspecified: Secondary | ICD-10-CM | POA: Diagnosis present

## 2015-07-09 DIAGNOSIS — Z3490 Encounter for supervision of normal pregnancy, unspecified, unspecified trimester: Secondary | ICD-10-CM

## 2015-07-09 DIAGNOSIS — O365932 Maternal care for other known or suspected poor fetal growth, third trimester, fetus 2: Principal | ICD-10-CM | POA: Diagnosis present

## 2015-07-09 DIAGNOSIS — O99214 Obesity complicating childbirth: Secondary | ICD-10-CM | POA: Diagnosis present

## 2015-07-09 DIAGNOSIS — Z3A37 37 weeks gestation of pregnancy: Secondary | ICD-10-CM | POA: Diagnosis not present

## 2015-07-09 DIAGNOSIS — O321XX2 Maternal care for breech presentation, fetus 2: Secondary | ICD-10-CM | POA: Diagnosis present

## 2015-07-09 DIAGNOSIS — O24425 Gestational diabetes mellitus in childbirth, controlled by oral hypoglycemic drugs: Secondary | ICD-10-CM | POA: Diagnosis present

## 2015-07-09 DIAGNOSIS — O30043 Twin pregnancy, dichorionic/diamniotic, third trimester: Secondary | ICD-10-CM | POA: Diagnosis present

## 2015-07-09 DIAGNOSIS — Z6841 Body Mass Index (BMI) 40.0 and over, adult: Secondary | ICD-10-CM | POA: Diagnosis not present

## 2015-07-09 DIAGNOSIS — Z87442 Personal history of urinary calculi: Secondary | ICD-10-CM

## 2015-07-09 DIAGNOSIS — Z833 Family history of diabetes mellitus: Secondary | ICD-10-CM | POA: Diagnosis not present

## 2015-07-09 LAB — BASIC METABOLIC PANEL
Anion gap: 8 (ref 5–15)
BUN: 14 mg/dL (ref 6–20)
CALCIUM: 9.3 mg/dL (ref 8.9–10.3)
CHLORIDE: 112 mmol/L — AB (ref 101–111)
CO2: 19 mmol/L — AB (ref 22–32)
CREATININE: 0.84 mg/dL (ref 0.44–1.00)
GFR calc Af Amer: >60 mL/min (ref >60)
GFR calc non Af Amer: >60 mL/min (ref >60)
GLUCOSE: 63 mg/dL — AB (ref 65–99)
Potassium: 4.2 mmol/L (ref 3.5–5.1)
Sodium: 139 mmol/L (ref 135–145)

## 2015-07-09 LAB — CBC
HCT: 36.3 % (ref 36.0–46.0)
Hemoglobin: 11.9 g/dL — ABNORMAL LOW (ref 12.0–15.0)
MCH: 25.9 pg — AB (ref 26.0–34.0)
MCHC: 32.8 g/dL (ref 30.0–36.0)
MCV: 79.1 fL (ref 78.0–100.0)
PLATELETS: 257 10*3/uL (ref 150–400)
RBC: 4.59 MIL/uL (ref 3.87–5.11)
RDW: 17.1 % — AB (ref 11.5–15.5)
WBC: 7.2 10*3/uL (ref 4.0–10.5)

## 2015-07-09 LAB — TYPE AND SCREEN
ABO/RH(D): O POS
ANTIBODY SCREEN: NEGATIVE

## 2015-07-09 LAB — GLUCOSE, CAPILLARY
GLUCOSE-CAPILLARY: 58 mg/dL — AB (ref 65–99)
GLUCOSE-CAPILLARY: 99 mg/dL (ref 65–99)
Glucose-Capillary: 66 mg/dL (ref 65–99)
Glucose-Capillary: 55 mg/dL — ABNORMAL LOW (ref 65–99)

## 2015-07-09 SURGERY — Surgical Case
Anesthesia: Spinal

## 2015-07-09 MED ORDER — ZOLPIDEM TARTRATE 5 MG PO TABS: 5.0000 mg | ORAL_TABLET | Freq: Every evening | ORAL | Status: DC | PRN

## 2015-07-09 MED ORDER — MORPHINE SULFATE (PF) 0.5 MG/ML IJ SOLN
INTRAMUSCULAR | Status: AC
  Filled 2015-07-09: qty 10

## 2015-07-09 MED ORDER — MEPERIDINE HCL 25 MG/ML IJ SOLN
6.2500 mg | INTRAMUSCULAR | Status: DC | PRN
Start: 2015-07-09 — End: 2015-07-09

## 2015-07-09 MED ORDER — LACTATED RINGERS IV SOLN
INTRAVENOUS | Status: DC | PRN
  Administered 2015-07-09 (×2): via INTRAVENOUS

## 2015-07-09 MED ORDER — NALBUPHINE HCL 10 MG/ML IJ SOLN: 5.0000 mg | INTRAMUSCULAR | Status: DC | PRN

## 2015-07-09 MED ORDER — IBUPROFEN 600 MG PO TABS
600.0000 mg | ORAL_TABLET | Freq: Four times a day (QID) | ORAL | Status: DC
  Administered 2015-07-10 – 2015-07-11 (×6): 600 mg via ORAL
  Filled 2015-07-09 (×6): qty 1

## 2015-07-09 MED ORDER — WITCH HAZEL-GLYCERIN EX PADS: 1.0000 "application " | MEDICATED_PAD | CUTANEOUS | Status: DC | PRN

## 2015-07-09 MED ORDER — DIPHENHYDRAMINE HCL 50 MG/ML IJ SOLN: 12.5000 mg | INTRAMUSCULAR | Status: DC | PRN

## 2015-07-09 MED ORDER — SCOPOLAMINE 1 MG/3DAYS TD PT72
MEDICATED_PATCH | TRANSDERMAL | Status: AC
  Filled 2015-07-09: qty 1

## 2015-07-09 MED ORDER — BUPIVACAINE IN DEXTROSE 0.75-8.25 % IT SOLN
INTRATHECAL | Status: DC | PRN
  Administered 2015-07-09: 1.3 mL via INTRATHECAL

## 2015-07-09 MED ORDER — DEXTROSE 50 % IV SOLN
INTRAVENOUS | Status: AC
  Administered 2015-07-09: 12.5 mL via INTRAVENOUS
  Filled 2015-07-09: qty 50

## 2015-07-09 MED ORDER — PRENATAL MULTIVITAMIN CH
1.0000 | ORAL_TABLET | Freq: Every day | ORAL | Status: DC
  Administered 2015-07-10 – 2015-07-11 (×2): 1 via ORAL
  Filled 2015-07-09 (×2): qty 1

## 2015-07-09 MED ORDER — FENTANYL CITRATE (PF) 100 MCG/2ML IJ SOLN: 25.0000 ug | INTRAMUSCULAR | Status: DC | PRN

## 2015-07-09 MED ORDER — TETANUS-DIPHTH-ACELL PERTUSSIS 5-2.5-18.5 LF-MCG/0.5 IM SUSP: 0.5000 mL | Freq: Once | INTRAMUSCULAR | Status: DC

## 2015-07-09 MED ORDER — KETOROLAC TROMETHAMINE 30 MG/ML IJ SOLN
30.0000 mg | Freq: Four times a day (QID) | INTRAMUSCULAR | Status: AC | PRN
  Administered 2015-07-09: 30 mg via INTRAMUSCULAR

## 2015-07-09 MED ORDER — ACETAMINOPHEN 325 MG PO TABS
650.0000 mg | ORAL_TABLET | ORAL | Status: DC | PRN
  Filled 2015-07-09: qty 2

## 2015-07-09 MED ORDER — SENNOSIDES-DOCUSATE SODIUM 8.6-50 MG PO TABS
2.0000 | ORAL_TABLET | ORAL | Status: DC
  Administered 2015-07-11: 2 via ORAL
  Filled 2015-07-09: qty 2

## 2015-07-09 MED ORDER — NALOXONE HCL 2 MG/2ML IJ SOSY
1.0000 ug/kg/h | PREFILLED_SYRINGE | INTRAVENOUS | Status: DC | PRN
  Filled 2015-07-09: qty 2

## 2015-07-09 MED ORDER — OXYTOCIN 10 UNIT/ML IJ SOLN
INTRAMUSCULAR | Status: AC
  Filled 2015-07-09: qty 4

## 2015-07-09 MED ORDER — PHENYLEPHRINE 40 MCG/ML (10ML) SYRINGE FOR IV PUSH (FOR BLOOD PRESSURE SUPPORT)
PREFILLED_SYRINGE | INTRAVENOUS | Status: AC
  Filled 2015-07-09: qty 10

## 2015-07-09 MED ORDER — SIMETHICONE 80 MG PO CHEW
80.0000 mg | CHEWABLE_TABLET | ORAL | Status: DC
  Administered 2015-07-11: 80 mg via ORAL
  Filled 2015-07-09 (×2): qty 1

## 2015-07-09 MED ORDER — IBUPROFEN 600 MG PO TABS: 600.0000 mg | ORAL_TABLET | Freq: Four times a day (QID) | ORAL | Status: DC

## 2015-07-09 MED ORDER — FENTANYL CITRATE (PF) 100 MCG/2ML IJ SOLN
INTRAMUSCULAR | Status: DC | PRN
  Administered 2015-07-09: 12.5 ug via INTRATHECAL
  Administered 2015-07-09: 87.5 ug via INTRAVENOUS

## 2015-07-09 MED ORDER — LACTATED RINGERS IV SOLN
INTRAVENOUS | Status: DC
  Administered 2015-07-10: 1 mL via INTRAVENOUS

## 2015-07-09 MED ORDER — SCOPOLAMINE 1 MG/3DAYS TD PT72
MEDICATED_PATCH | TRANSDERMAL | Status: DC | PRN
  Administered 2015-07-09: 1 via TRANSDERMAL

## 2015-07-09 MED ORDER — CEFAZOLIN SODIUM-DEXTROSE 2-3 GM-% IV SOLR
2.0000 g | Freq: Once | INTRAVENOUS | Status: AC
  Administered 2015-07-09: 2 g via INTRAVENOUS
  Filled 2015-07-09: qty 50

## 2015-07-09 MED ORDER — SODIUM CHLORIDE 0.9% FLUSH: 3.0000 mL | INTRAVENOUS | Status: DC | PRN

## 2015-07-09 MED ORDER — OXYTOCIN 10 UNIT/ML IJ SOLN: 2.5000 [IU]/h | INTRAMUSCULAR | Status: AC

## 2015-07-09 MED ORDER — MORPHINE SULFATE (PF) 0.5 MG/ML IJ SOLN
INTRAMUSCULAR | Status: DC | PRN
  Administered 2015-07-09: .1 mg via INTRATHECAL

## 2015-07-09 MED ORDER — NALBUPHINE HCL 10 MG/ML IJ SOLN: 5.0000 mg | Freq: Once | INTRAMUSCULAR | Status: DC | PRN

## 2015-07-09 MED ORDER — CITRIC ACID-SODIUM CITRATE 334-500 MG/5ML PO SOLN
ORAL | Status: AC
  Administered 2015-07-09: 16:00:00
  Filled 2015-07-09: qty 15

## 2015-07-09 MED ORDER — ONDANSETRON HCL 4 MG/2ML IJ SOLN
INTRAMUSCULAR | Status: AC
  Filled 2015-07-09: qty 2

## 2015-07-09 MED ORDER — SCOPOLAMINE 1 MG/3DAYS TD PT72
1.0000 | MEDICATED_PATCH | Freq: Once | TRANSDERMAL | Status: DC
  Filled 2015-07-09: qty 1

## 2015-07-09 MED ORDER — ONDANSETRON HCL 4 MG/2ML IJ SOLN
4.0000 mg | Freq: Three times a day (TID) | INTRAMUSCULAR | Status: DC | PRN
Start: 2015-07-09 — End: 2015-07-11

## 2015-07-09 MED ORDER — PHENYLEPHRINE 8 MG IN D5W 100 ML (0.08MG/ML) PREMIX OPTIME
INJECTION | INTRAVENOUS | Status: AC
  Filled 2015-07-09: qty 100

## 2015-07-09 MED ORDER — DEXTROSE 50 % IV SOLN
0.2500 | Freq: Once | INTRAVENOUS | Status: AC
  Administered 2015-07-09: 12.5 mL via INTRAVENOUS

## 2015-07-09 MED ORDER — DIBUCAINE 1 % RE OINT: 1.0000 "application " | TOPICAL_OINTMENT | RECTAL | Status: DC | PRN

## 2015-07-09 MED ORDER — PHENYLEPHRINE 8 MG IN D5W 100 ML (0.08MG/ML) PREMIX OPTIME
INJECTION | INTRAVENOUS | Status: DC | PRN
  Administered 2015-07-09: 50 ug/min via INTRAVENOUS

## 2015-07-09 MED ORDER — DEXAMETHASONE SODIUM PHOSPHATE 10 MG/ML IJ SOLN
INTRAMUSCULAR | Status: DC | PRN
  Administered 2015-07-09: 10 mg via INTRAVENOUS

## 2015-07-09 MED ORDER — DIPHENHYDRAMINE HCL 25 MG PO CAPS
25.0000 mg | ORAL_CAPSULE | ORAL | Status: DC | PRN
Start: 2015-07-09 — End: 2015-07-11

## 2015-07-09 MED ORDER — SODIUM CHLORIDE 0.9 % IR SOLN
Status: DC | PRN
  Administered 2015-07-09: 1000 mL

## 2015-07-09 MED ORDER — MEASLES, MUMPS & RUBELLA VAC ~~LOC~~ INJ
0.5000 mL | INJECTION | Freq: Once | SUBCUTANEOUS | Status: DC
  Filled 2015-07-09: qty 0.5

## 2015-07-09 MED ORDER — FENTANYL CITRATE (PF) 100 MCG/2ML IJ SOLN
INTRAMUSCULAR | Status: AC
  Filled 2015-07-09: qty 2

## 2015-07-09 MED ORDER — KETOROLAC TROMETHAMINE 30 MG/ML IJ SOLN
30.0000 mg | Freq: Four times a day (QID) | INTRAMUSCULAR | Status: AC | PRN
  Administered 2015-07-10: 30 mg via INTRAVENOUS
  Filled 2015-07-09: qty 1

## 2015-07-09 MED ORDER — LACTATED RINGERS IV SOLN
INTRAVENOUS | Status: DC | PRN
  Administered 2015-07-09: 17:00:00 via INTRAVENOUS

## 2015-07-09 MED ORDER — ONDANSETRON HCL 4 MG/2ML IJ SOLN
INTRAMUSCULAR | Status: DC | PRN
  Administered 2015-07-09: 4 mg via INTRAVENOUS

## 2015-07-09 MED ORDER — DEXAMETHASONE SODIUM PHOSPHATE 10 MG/ML IJ SOLN
INTRAMUSCULAR | Status: AC
  Filled 2015-07-09: qty 1

## 2015-07-09 MED ORDER — OXYTOCIN 10 UNIT/ML IJ SOLN
40.0000 [IU] | INTRAMUSCULAR | Status: DC | PRN
  Administered 2015-07-09: 40 [IU] via INTRAVENOUS

## 2015-07-09 MED ORDER — KETOROLAC TROMETHAMINE 30 MG/ML IJ SOLN
INTRAMUSCULAR | Status: AC
  Administered 2015-07-09: 30 mg via INTRAMUSCULAR
  Filled 2015-07-09: qty 1

## 2015-07-09 MED ORDER — NALOXONE HCL 0.4 MG/ML IJ SOLN: 0.4000 mg | INTRAMUSCULAR | Status: DC | PRN

## 2015-07-09 MED ORDER — LACTATED RINGERS IV SOLN: INTRAVENOUS | Status: DC

## 2015-07-09 SURGICAL SUPPLY — 29 items
CLAMP CORD UMBIL (MISCELLANEOUS) ×12 IMPLANT
CLOTH BEACON ORANGE TIMEOUT ST (SAFETY) ×4 IMPLANT
CONTAINER PREFILL 10% NBF 15ML (MISCELLANEOUS) IMPLANT
DRSG OPSITE POSTOP 4X10 (GAUZE/BANDAGES/DRESSINGS) ×4 IMPLANT
DURAPREP 26ML APPLICATOR (WOUND CARE) ×4 IMPLANT
ELECT REM PT RETURN 9FT ADLT (ELECTROSURGICAL) ×4
ELECTRODE REM PT RTRN 9FT ADLT (ELECTROSURGICAL) ×2 IMPLANT
EXTRACTOR VACUUM M CUP 4 TUBE (SUCTIONS) IMPLANT
EXTRACTOR VACUUM M CUP 4' TUBE (SUCTIONS)
GAUZE SPONGE 4X4 12PLY STRL LF (GAUZE/BANDAGES/DRESSINGS) ×4 IMPLANT
GLOVE BIOGEL PI IND STRL 7.0 (GLOVE) ×2 IMPLANT
GLOVE BIOGEL PI INDICATOR 7.0 (GLOVE) ×2
GLOVE ECLIPSE 7.0 STRL STRAW (GLOVE) ×8 IMPLANT
GOWN STRL REUS W/TWL LRG LVL3 (GOWN DISPOSABLE) ×8 IMPLANT
KIT ABG SYR 3ML LUER SLIP (SYRINGE) ×8 IMPLANT
NEEDLE HYPO 25X5/8 SAFETYGLIDE (NEEDLE) ×8 IMPLANT
NS IRRIG 1000ML POUR BTL (IV SOLUTION) ×4 IMPLANT
PACK C SECTION WH (CUSTOM PROCEDURE TRAY) ×4 IMPLANT
PAD ABD 8X10 STRL (GAUZE/BANDAGES/DRESSINGS) ×4 IMPLANT
PAD OB MATERNITY 4.3X12.25 (PERSONAL CARE ITEMS) ×4 IMPLANT
RETRACTOR TRAXI PANNICULUS (MISCELLANEOUS) ×2 IMPLANT
STAPLER VISISTAT 35W (STAPLE) ×4 IMPLANT
SUT MNCRL 0 VIOLET CTX 36 (SUTURE) ×6 IMPLANT
SUT MON AB 2-0 CT1 27 (SUTURE) ×8 IMPLANT
SUT MONOCRYL 0 CTX 36 (SUTURE) ×6
SUT PLAIN 0 NONE (SUTURE) IMPLANT
TOWEL OR 17X24 6PK STRL BLUE (TOWEL DISPOSABLE) ×4 IMPLANT
TRAXI PANNICULUS RETRACTOR (MISCELLANEOUS) ×2
TRAY FOLEY CATH SILVER 14FR (SET/KITS/TRAYS/PACK) IMPLANT

## 2015-07-09 NOTE — Anesthesia Preprocedure Evaluation (Addendum)
Anesthesia Evaluation  Patient identified by MRN, date of birth, ID band Patient awake    Reviewed: Allergy & Precautions, NPO status , Patient's Chart, lab work & pertinent test results  Airway Mallampati: II  TM Distance: >3 FB Neck ROM: Full    Dental no notable dental hx.    Pulmonary neg pulmonary ROS,    Pulmonary exam normal breath sounds clear to auscultation       Cardiovascular negative cardio ROS Normal cardiovascular exam Rhythm:Regular Rate:Normal     Neuro/Psych negative neurological ROS  negative psych ROS   GI/Hepatic negative GI ROS, Neg liver ROS,   Endo/Other  diabetes, GestationalMorbid obesity  Renal/GU negative Renal ROS  negative genitourinary   Musculoskeletal negative musculoskeletal ROS (+)   Abdominal   Peds negative pediatric ROS (+)  Hematology negative hematology ROS (+)   Anesthesia Other Findings   Reproductive/Obstetrics (+) Pregnancy twins                            Anesthesia Physical Anesthesia Plan  ASA: II  Anesthesia Plan: Spinal   Post-op Pain Management:    Induction:   Airway Management Planned: Natural Airway  Additional Equipment:   Intra-op Plan:   Post-operative Plan:   Informed Consent: I have reviewed the patients History and Physical, chart, labs and discussed the procedure including the risks, benefits and alternatives for the proposed anesthesia with the patient or authorized representative who has indicated his/her understanding and acceptance.   Dental advisory given  Plan Discussed with: CRNA  Anesthesia Plan Comments:         Anesthesia Quick Evaluation

## 2015-07-09 NOTE — H&P (Signed)
Lauren Collins is an 25 y.o. G3P0020 7044w0d white female with twins who is going to the OR for an elective C/S. She was recently found to discordance between the twins. Twin B, the smaller twin also has an elevated doppler. Today she was found to have 2+ proteinuria. She had a consult with MFM today who recommended delivery today. Despite advice from MFM and myself she has elected to have a c/s.  Chief Complaint: HPI:  Past Medical History  Diagnosis Date  . Anemia   . Hx of chlamydia infection   . Kidney stones   . Hx of varicella   . Gestational diabetes     glyburide    Past Surgical History  Procedure Laterality Date  . No past surgeries      Family History  Problem Relation Age of Onset  . Diabetes Father   . Kidney disease Paternal Grandmother   . Alcohol abuse Neg Hx   . Arthritis Neg Hx   . Asthma Neg Hx   . Birth defects Neg Hx   . Cancer Neg Hx   . COPD Neg Hx   . Depression Neg Hx   . Drug abuse Neg Hx   . Early death Neg Hx   . Hearing loss Neg Hx   . Heart disease Neg Hx   . Hyperlipidemia Neg Hx   . Hypertension Neg Hx   . Learning disabilities Neg Hx   . Mental illness Neg Hx   . Mental retardation Neg Hx   . Miscarriages / Stillbirths Neg Hx   . Stroke Neg Hx   . Vision loss Neg Hx   . Varicose Veins Neg Hx    Social History:  reports that she has never smoked. She has never used smokeless tobacco. She reports that she does not drink alcohol or use illicit drugs.  Allergies: No Known Allergies  Medications Prior to Admission  Medication Sig Dispense Refill  . glyBURIDE (DIABETA) 5 MG tablet Take 5 mg by mouth every morning.  4  . OVER THE COUNTER MEDICATION Take 1 tablet by mouth daily. she is taking an over he counter [facility] iron tablet    . Prenatal Vit-Fe Fumarate-FA (PRENATAL MULTIVITAMIN) TABS tablet Take 1 tablet by mouth daily at 12 noon.          Blood pressure 132/83, pulse 87, height 5' (1.524 m), weight 223 lb (101.152 kg), last menstrual  period 10/23/2014. General appearance: alert Abdomen: soft, non-tender; bowel sounds normal; no masses,  no organomegaly and gravid   Lab Results  Component Value Date   WBC 10.9* 05/10/2015   HGB 9.4* 05/10/2015   HCT 29.9* 05/10/2015   MCV 79.9 05/10/2015   PLT 274 05/10/2015   Lab Results  Component Value Date   PREGTESTUR POSITIVE* 12/03/2014       Patient Active Problem List   Diagnosis Date Noted  . Threatened preterm labor 05/09/2015   Imp/ Twin preg with discordance         GDM Plan/ To OR for C/S.  Levi AlandNDERSON,MARK E 07/09/2015, 3:48 PM

## 2015-07-09 NOTE — Transfer of Care (Signed)
Immediate Anesthesia Transfer of Care Note  Patient: Lauren Collins  Procedure(s) Performed: Procedure(s): CESAREAN SECTION (N/A)  Patient Location: PACU  Anesthesia Type:Spinal  Level of Consciousness: awake, alert , oriented and patient cooperative  Airway & Oxygen Therapy: Patient Spontanous Breathing  Post-op Assessment: Report given to RN and Post -op Vital signs reviewed and stable  Post vital signs: Reviewed and stable  Last Vitals:  Filed Vitals:   07/09/15 1613 07/09/15 1630  BP:    Pulse:    Temp:    Resp: 18 20    Complications: No apparent anesthesia complications

## 2015-07-09 NOTE — Progress Notes (Signed)
MATERNAL FETAL MEDICINE CONSULT  Patient Name: Lauren Collins Medical Record Number:  [id number] Date of Birth: 12-Apr-1991 Requesting Physician Name:  Lauren Baarsyanna Clark, MD Date of Service: 07/09/2015  Chief Complaint Discordant twin growth  History of Present Illness Lauren SitesHeavenly Collins was seen today secondary to discordant twin growth at the request of Lauren Baarsyanna Clark, MD.  The patient is a 25 y.o. G3P0020,at 2064w0d with an EDD of 07/30/2015, by Last Menstrual Period dating method.  She was seen recently in her primary OBs office and found to have a 27% growth discordance with Twin B having a small abdominal circumference.  BPPs were 8/8 for both twins.  She has no other issues or complaints at this time other than the expected musculoskeletal discomfort associated with a term twin gestation.  Review of Systems Pertinent items are noted in HPI.  Patient History OB History  Gravida Para Term Preterm AB SAB TAB Ectopic Multiple Living  3    2 1 1    0    # Outcome Date GA Lbr Len/2nd Weight Sex Delivery Anes PTL Lv  3 Current           2 SAB           1 TAB               Past Medical History  Diagnosis Date  . Anemia   . Hx of chlamydia infection   . Kidney stones   . Hx of varicella   . Gestational diabetes     glyburide    Past Surgical History  Procedure Laterality Date  . No past surgeries      Social History   Social History  . Marital Status: Single    Spouse Name: N/A  . Number of Children: N/A  . Years of Education: N/A   Social History Main Topics  . Smoking status: Never Smoker   . Smokeless tobacco: Never Used  . Alcohol Use: No  . Drug Use: No  . Sexual Activity: Yes    Birth Control/ Protection: None   Other Topics Concern  . None   Social History Narrative    Family History  Problem Relation Age of Onset  . Diabetes Father   . Kidney disease Paternal Grandmother   . Alcohol abuse Neg Hx   . Arthritis Neg Hx   . Asthma Neg Hx   . Birth defects Neg Hx    . Cancer Neg Hx   . COPD Neg Hx   . Depression Neg Hx   . Drug abuse Neg Hx   . Early death Neg Hx   . Hearing loss Neg Hx   . Heart disease Neg Hx   . Hyperlipidemia Neg Hx   . Hypertension Neg Hx   . Learning disabilities Neg Hx   . Mental illness Neg Hx   . Mental retardation Neg Hx   . Miscarriages / Stillbirths Neg Hx   . Stroke Neg Hx   . Vision loss Neg Hx   . Varicose Veins Neg Hx    In addition, the patient has no family history of mental retardation, birth defects, or genetic diseases.  Physical Examination Filed Vitals:   07/09/15 1446  BP: 140/78  Pulse: 92   General appearance - alert, well appearing, and in no distress Mental status - alert, oriented to person, place, and time  Assessment and Recommendations 1.  Di/Di twins with 27% growth discordance.  Given the significant growth discordance and small abdominal circumference  of Twin B I recommend delivery at this time.  Both of these findings are associated with an increased risk of stillbirth, and she would otherwise be delivered in just one week.  Thus, I feel the benefits of delivery now outweigh the risk of respiratory and feeding difficulties (which I explained to Lauren Collins) that can occur with an early term delivery.  I also discussed the risks and benefits of proceeding with an induction of labor and attempted vaginal delivery of twins and a primary cesarean section.  She would prefer to have a cesarean section.  It has been less than 6-8 hours since her last meal, so her c-section will be delayed until she has been NPO for a sufficient period of time.  I have discussed Lauren Collins's case with Dr. Dareen Piano, who asked that Lauren Collins be sent to the MAU in preparation for her c-section later today.  I spent 30 minutes with Lauren Collins today of which 50% was face-to-face counseling.  Thank you for referring Lauren Collins to the [facility].  Please do not hesitate to contact us with questions.   Rema Fendt, MD

## 2015-07-09 NOTE — Anesthesia Postprocedure Evaluation (Signed)
Anesthesia Post Note  Patient: Lauren SitesHeavenly Collins  Procedure(s) Performed: Procedure(s) (LRB): CESAREAN SECTION (N/A)  Patient location during evaluation: PACU Anesthesia Type: Spinal Level of consciousness: oriented and awake and alert Pain management: pain level controlled Vital Signs Assessment: post-procedure vital signs reviewed and stable Respiratory status: spontaneous breathing, respiratory function stable and patient connected to nasal cannula oxygen Cardiovascular status: blood pressure returned to baseline and stable Postop Assessment: no headache, no backache and spinal receding Anesthetic complications: no    Last Vitals:  Filed Vitals:   07/09/15 1945 07/09/15 2000  BP: 129/89 124/75  Pulse: 70 70  Temp:    Resp: 14 20    Last Pain:  Filed Vitals:   07/09/15 2012  PainSc: 5     LLE Motor Response: Purposeful movement (07/09/15 2000) LLE Sensation: Full sensation (07/09/15 2000) RLE Motor Response: Purposeful movement (07/09/15 2000) RLE Sensation: Tingling (07/09/15 2000)      Pansey Pinheiro J

## 2015-07-09 NOTE — Anesthesia Procedure Notes (Signed)
Spinal Patient location during procedure: OR Staffing Anesthesiologist: Kaegan Stigler Performed by: anesthesiologist  Preanesthetic Checklist Completed: patient identified, site marked, surgical consent, pre-op evaluation, timeout performed, IV checked, risks and benefits discussed and monitors and equipment checked Spinal Block Patient position: sitting Prep: ChloraPrep Patient monitoring: heart rate, continuous pulse ox and blood pressure Approach: midline Location: L3-4 Injection technique: single-shot Needle Needle type: Sprotte  Needle gauge: 24 G Needle length: 9 cm Additional Notes Expiration date of kit checked and confirmed. Patient tolerated procedure well, without complications.     

## 2015-07-10 ENCOUNTER — Encounter (HOSPITAL_COMMUNITY): Payer: Self-pay | Admitting: *Deleted

## 2015-07-10 LAB — CBC
HEMATOCRIT: 27.4 % — AB (ref 36.0–46.0)
HEMOGLOBIN: 8.9 g/dL — AB (ref 12.0–15.0)
MCH: 25.6 pg — AB (ref 26.0–34.0)
MCHC: 32.5 g/dL (ref 30.0–36.0)
MCV: 78.7 fL (ref 78.0–100.0)
PLATELETS: 216 10*3/uL (ref 150–400)
RBC: 3.48 MIL/uL — AB (ref 3.87–5.11)
RDW: 16.9 % — ABNORMAL HIGH (ref 11.5–15.5)
WBC: 13.2 10*3/uL — AB (ref 4.0–10.5)

## 2015-07-10 LAB — RPR: RPR: NONREACTIVE

## 2015-07-10 MED ORDER — INFLUENZA VAC SPLIT QUAD 0.5 ML IM SUSY
0.5000 mL | PREFILLED_SYRINGE | INTRAMUSCULAR | Status: AC
  Administered 2015-07-11: 0.5 mL via INTRAMUSCULAR
  Filled 2015-07-10: qty 0.5

## 2015-07-10 MED ORDER — OXYCODONE-ACETAMINOPHEN 5-325 MG PO TABS
1.0000 | ORAL_TABLET | ORAL | Status: DC | PRN
Start: 2015-07-10 — End: 2015-07-11
  Administered 2015-07-10: 1 via ORAL

## 2015-07-10 MED ORDER — OXYCODONE-ACETAMINOPHEN 5-325 MG PO TABS
2.0000 | ORAL_TABLET | ORAL | Status: DC | PRN
  Administered 2015-07-10: 2 via ORAL
  Filled 2015-07-10 (×2): qty 2

## 2015-07-10 NOTE — Lactation Note (Signed)
This note was copied from a baby's chart. Lactation Consultation Note; Mom reports babies just finished feeding and that they did better this feeding. Nursed both babies at the same time. Has been supplementing with formula due to late preterm and not nursing well. DEBP set up, reviewed use and cleaning of pump parts Mom pumped for 15 min few drops obtained from left breast. Encouraged to hand express after pumping. Encouraged to nurse babies with feeding cues then pump afterwards and feed any EBM and formula to babies. Late preterm guidelines given and reviewed with mom. BF brochure given with resources for support after DC. No questions at present. To call for assist prn  Patient Name: Lauren Collins ZOXWR'UToday's Date: 07/10/2015 Reason for consult: Initial assessment;Multiple gestation;Late preterm infant   Maternal Data Formula Feeding for Exclusion: Yes Reason for exclusion: Mother's choice to formula and breast feed on admission Has patient been taught Hand Expression?: Yes Does the patient have breastfeeding experience prior to this delivery?: No  Feeding Feeding Type: Breast Fed Length of feed: 10 min  LATCH Score/Interventions Latch: Repeated attempts needed to sustain latch, nipple held in mouth throughout feeding, stimulation needed to elicit sucking reflex. Intervention(s): Skin to skin;Teach feeding cues  Audible Swallowing: A few with stimulation Intervention(s): Skin to skin;Hand expression  Type of Nipple: Everted at rest and after stimulation  Comfort (Breast/Nipple): Soft / non-tender     Hold (Positioning): Full assist, staff holds infant at breast  LATCH Score: 6  Lactation Tools [facility]ed/[facility] Review: Setup, frequency, and cleaning Initiated by:: DW Date initiated:: 07/10/15   Consult Status Consult Status: Follow-up Date: 07/11/15 Follow-up type: In-patient    Pamelia HoitWeeks, Jillane Po D 07/10/2015, 11:34 AM

## 2015-07-10 NOTE — Op Note (Signed)
[name]:  [name], Lauren               ACCOUNT NO.:  [id number]  MEDICAL RECORD NO.:  [id number]  LOCATION:  9131                          FACILITY:  [facility]  PHYSICIAN:  Malva LimesMark Anderson, M.D.    DATE OF BIRTH:  Aug 20, 1990  DATE OF PROCEDURE:  07/09/2015 DATE OF DISCHARGE:                              OPERATIVE REPORT   PREOPERATIVE DIAGNOSES: 1. Intrauterine pregnancy at 37 weeks estimated gestational age. 2. Fetal discordance. 3. The patient desires elective cesarean section.  POSTOPERATIVE DIAGNOSES: 1. Intrauterine pregnancy at 37 weeks estimated gestational age. 2. Fetal discordance. 3. The patient desires elective cesarean section.  PROCEDURE:  Primary low transverse cesarean section.  SURGEON:  Malva LimesMark Anderson, M.D.  ANESTHESIA:  Spinal.  ANTIBIOTICS:  Ancef 2 g.  DRAINS:  Foley bedside drainage.  ESTIMATED BLOOD LOSS:  900 mL.  SPECIMENS:  Placenta sent to pathology.  COMPLICATIONS:  None.  FINDINGS:  The patient had normal fallopian tubes and ovaries bilaterally.  PROCEDURE:  The patient was taken to the operating room, where spinal anesthetic was administered without difficulty.  She was then prepped and draped in usual fashion for this procedure.  A Foley catheter was placed in her bladder.  A Pfannenstiel incision was made 2 cm above the pubic symphysis.  On entering the abdominal cavity, the bladder flap was taken down with sharp dissection.  A low-transverse uterine incision was made in the midline using Metzenbaum scissors.  On entering the uterine cavity, the amniotic sac was ruptured.  The fluid was clear.  Twin A was delivered in a vertex presentation.  On delivery of the head, the oropharynx and nostrils were bulb suctioned.  Remaining infant was then delivered.  The cord was doubly clamped and cut and the infant handed to the waiting NICU team.  The 2nd sac was also ruptured and twin B was then delivered in the breech presentation.  On delivery of the head,  the oropharynx and nostrils were bulb suctioned.  The cord doubly clamped, cut, and the infant handed to the awaiting NICU team.  Cord blood was then obtained.  The placenta was manually removed.  The uterus was exteriorized.  Uterine cavity was wiped with a wet lap and examined. The incision was closed in a single layer of 0 Monocryl suture in a running, locking fashion.  The uterus was placed back in the abdominal cavity.  Hemostasis was rechecked and felt to be adequate.  The parietal peritoneum and rectus muscles reapproximated midline using 2-0 Monocryl in a running fashion.  The fascia was closed using 0 Monocryl suture in running fashion.  Subcuticular tissue was made hemostatic with the Bovie.  Stainless steel clips used to close the skin.  The patient tolerated the procedure well.  Instrument lap counts correct x3.          ______________________________ Malva LimesMark Anderson, M.D.     MA/MEDQ  D:  07/09/2015  T:  07/10/2015  Job:  [id number]

## 2015-07-10 NOTE — Anesthesia Postprocedure Evaluation (Signed)
Anesthesia Post Note  Patient: Sharilyn SitesHeavenly Stranahan  Procedure(s) Performed: Procedure(s) (LRB): CESAREAN SECTION (N/A)  Patient location during evaluation: Mother Baby Anesthesia Type: Spinal Level of consciousness: oriented and awake and alert Pain management: pain level controlled Vital Signs Assessment: post-procedure vital signs reviewed and stable Respiratory status: spontaneous breathing and respiratory function stable Cardiovascular status: blood pressure returned to baseline and stable Postop Assessment: no headache and no backache Anesthetic complications: no    Last Vitals:  Filed Vitals:   07/10/15 0400 07/10/15 0900  BP: 121/79 123/71  Pulse: 67 77  Temp: 36.6 C 36.6 C  Resp: 18 18    Last Pain:  Filed Vitals:   07/10/15 0916  PainSc: 0-No pain                 Conny Situ

## 2015-07-10 NOTE — Progress Notes (Signed)
Patient is doing well.  She is tolerating PO, ambulating.  Foley catheter not yet removed.  Pain is controlled.  Lochia is appropriate  Filed Vitals:   07/09/15 2235 07/09/15 2335 07/10/15 0400 07/10/15 0900  BP: 128/64 134/68 121/79 123/71  Pulse: 70 74 67 77  Temp: 97.9 F (36.6 C) 97.9 F (36.6 C) 97.9 F (36.6 C) 97.9 F (36.6 C)  TempSrc: Oral Oral Oral Oral  Resp: 18 20 18 18   Height:      Weight:      SpO2: 98% 96% 96% 97%    NAD Abdomen:  soft, appropriate tenderness, incisions intact and without erythema or drainage ext:    Symmetric, 1+ edema bilaterally  Lab Results  Component Value Date   WBC 13.2* 07/10/2015   HGB 8.9* 07/10/2015   HCT 27.4* 07/10/2015   MCV 78.7 07/10/2015   PLT 216 07/10/2015    --/--/O POS (03/14 1535)/RImmune  A/P    24 y.o. [id number] POD 1 s/p primary c/s for di-di twin pregnancy Routine post op and postpartum care.    Desires circumcision. Discussed r/b/a of the procedure. Reviewed that circumcision is an elective surgical procedure and not considered medically necessary. Reviewed the risks of the procedure including the risk of infection, bleeding, damage to surrounding structures, including scrotum, shaft, urethra and head of penis, and an undesired cosmetic effect requiring additional procedures for revision. Consent signed for both boys.

## 2015-07-10 NOTE — Addendum Note (Signed)
Addendum  created 07/10/15 1008 by Junious SilkMelinda Chick Cousins, CRNA   Modules edited: Clinical Notes   Clinical Notes:  File: [id number]

## 2015-07-11 MED ORDER — OXYCODONE-ACETAMINOPHEN 5-325 MG PO TABS: 1.0000 | ORAL_TABLET | ORAL | Status: DC | PRN

## 2015-07-11 NOTE — Discharge Summary (Signed)
Obstetric Discharge Summary Reason for Admission: dicordant twins at term Prenatal Procedures: NST and ultrasound Intrapartum Procedures: cesarean: low cervical, transverse Postpartum Procedures: none Complications-Operative and Postpartum: none HEMOGLOBIN  Date Value Ref Range Status  07/10/2015 8.9* 12.0 - 15.0 g/dL Final    Comment:    REPEATED TO VERIFY DELTA CHECK NOTED    HCT  Date Value Ref Range Status  07/10/2015 27.4* 36.0 - 46.0 % Final    Discharge Diagnoses: Term Pregnancy-delivered  Discharge Information: Date: 07/11/2015 Activity: pelvic rest Diet: routine Medications: Iron and Percocet Condition: stable Instructions: refer to practice specific booklet Discharge to: home Follow-up Information    Follow up with Levi AlandANDERSON,MARK E, MD In 4 weeks.   Specialty:  Obstetrics and Gynecology   Contact information:   [address] [place] [phone]       Newborn Data:   Fulton MoleWebb, Boy Lauren [[id number]][[id number]]  Live born female  Birth Weight: 6 lb 3.8 oz (2830 g) APGAR: 8, 9   Barnett AbuWebb, BoyB Lauren [[id number]][[id number]]  Live born female  Birth Weight: 5 lb 1.5 oz (2310 g) APGAR: 8, 9  Home with mother.  Lauren Collins A 07/11/2015, 7:20 AM

## 2015-07-11 NOTE — Progress Notes (Signed)
  Patient is eating, ambulating, voiding.  Pain control is good.  Filed Vitals:   07/10/15 1225 07/10/15 1552 07/10/15 1811 07/11/15 0621  BP: 121/73 121/71 124/78 127/88  Pulse: 81 87 78 91  Temp: 98.8 F (37.1 C) 98.4 F (36.9 C) 98.3 F (36.8 C) 98 F (36.7 C)  TempSrc:  Oral Axillary Oral  Resp: 18 18 20 19   Height:      Weight:      SpO2: 99%       lungs:   clear to auscultation cor:    RRR Abdomen:  soft, appropriate tenderness, incisions intact and without erythema or exudate ex:    no cords   Lab Results  Component Value Date   WBC 13.2* 07/10/2015   HGB 8.9* 07/10/2015   HCT 27.4* 07/10/2015   MCV 78.7 07/10/2015   PLT 216 07/10/2015    --/--/O POS (03/14 1535)/RI  A/P    Post operative day 2.  Routine post op and postpartum care.  Expect d/c routine.  Percocet for pain control.

## 2015-07-11 NOTE — Lactation Note (Signed)
This note was copied from a baby's chart. Lactation Consultation Note  Patient Name: Lauren Collins ZOXWR'UToday's Date: 07/11/2015 Reason for consult: Follow-up assessment;Multiple gestation;Infant < 6lbs;Late preterm infant   Follow up with first time mom of 4942 hour old twins born at 1537 weeks GA.   Twin A "Chance" with 1 BF for 10 minutes, 6 syringe/bottle supplementations 2-60cc Neocare 22 cal, 3 voids and 0 stools in last 24 hours. Infant weight 5 lb 15.6 oz with a weight loss of 4 % since birth. Mom's friend was feeding Chance and he took 2 cc. I finished bottle feed and was able to get infant to take 13 cc. He was very sleepy with minimal attempts at sucking.   Twin B "Chase"  With 4 BF for 10-20 minutes, 5 syringe/bottle feeds of 5-10 cc, 3 voids and 3 stools in last 24 hours,. Infant weight 4 lb 14.6 oz with a 4 % weight loss since birth.   Mom reports she has pumped x 3 since yesterday, she is not getting much volume yet. Mom reports she has a Medela pump at home for use. Enc mom to pump post BF up to 8 x a day to stimulate milk productions. Mom reports she has changed both babies to bottles.   Reviewed LPT infant policy with mom, advised that infants supplementation should be 8 x in 24 hours and reviewed amounts per LPT infant policy and say of age. Mom reports that she does not feel that the babies will eat more.  Reviewed all BF information in Taking Care of Baby and Me Booklet. Engorgement prevention/treatment reviewed. I/O reviewed and enc mom to maintain Feeding log and take to ped appt.  Unsure if infant will be d/c today due to feeding and Chance having bleeding post circumcision. They do have a f/u Ped appt scheduled for tomorrow.   [facility] Brochure reviewed, mom aware of OP Services, LC phone # and BF Support Groups. Enc mom to call for questions, concerns, or  feeding assistance as needed prior to d/c and after.        Maternal Data Does the patient have breastfeeding  experience prior to this delivery?: No  Feeding Feeding Type: Bottle Fed - Formula Nipple Type: Slow - flow  LATCH Score/Interventions                      Lactation Tools Discussed/Used Pump Review: Setup, frequency, and cleaning;Milk Storage   Consult Status Consult Status: Complete Follow-up type: Call as needed    Ed BlalockSharon S Hice 07/11/2015, 11:53 AM

## 2015-07-12 ENCOUNTER — Ambulatory Visit (HOSPITAL_COMMUNITY): Admission: RE | Admit: 2015-07-12 | Payer: 59 | Source: Ambulatory Visit

## 2015-07-12 ENCOUNTER — Encounter (HOSPITAL_COMMUNITY): Payer: 59

## 2015-07-12 LAB — GLUCOSE, CAPILLARY: GLUCOSE-CAPILLARY: 90 mg/dL (ref 65–99)

## 2015-07-15 ENCOUNTER — Inpatient Hospital Stay (HOSPITAL_COMMUNITY): Admission: RE | Admit: 2015-07-15 | Payer: 59 | Source: Ambulatory Visit

## 2015-07-16 ENCOUNTER — Other Ambulatory Visit (HOSPITAL_COMMUNITY): Payer: Self-pay

## 2015-07-17 ENCOUNTER — Inpatient Hospital Stay (HOSPITAL_COMMUNITY): Admission: RE | Admit: 2015-07-17 | Payer: 59 | Source: Ambulatory Visit | Admitting: Obstetrics

## 2015-07-17 SURGERY — Surgical Case
Anesthesia: Regional

## 2016-01-10 ENCOUNTER — Encounter (HOSPITAL_COMMUNITY): Payer: Self-pay | Admitting: Emergency Medicine

## 2016-01-10 ENCOUNTER — Emergency Department (HOSPITAL_COMMUNITY)
Admission: EM | Admit: 2016-01-10 | Discharge: 2016-01-10 | Disposition: A | Discharge status: Home / Self Care | Payer: 59 | Attending: Emergency Medicine | Admitting: Emergency Medicine

## 2016-01-10 DIAGNOSIS — Z79899 Other long term (current) drug therapy: Secondary | ICD-10-CM | POA: Diagnosis not present

## 2016-01-10 DIAGNOSIS — L02419 Cutaneous abscess of limb, unspecified: Secondary | ICD-10-CM

## 2016-01-10 DIAGNOSIS — L02412 Cutaneous abscess of left axilla: Secondary | ICD-10-CM | POA: Diagnosis not present

## 2016-01-10 DIAGNOSIS — Z791 Long term (current) use of non-steroidal anti-inflammatories (NSAID): Secondary | ICD-10-CM | POA: Diagnosis not present

## 2016-01-10 DIAGNOSIS — L732 Hidradenitis suppurativa: Secondary | ICD-10-CM | POA: Diagnosis not present

## 2016-01-10 MED ORDER — NAPROXEN 375 MG PO TABS: 375.0000 mg | ORAL_TABLET | Freq: Two times a day (BID) | ORAL | 0 refills | Status: DC

## 2016-01-10 MED ORDER — SODIUM BICARBONATE 4 % IV SOLN
5.0000 mL | Freq: Once | INTRAVENOUS | Status: AC
  Administered 2016-01-10: 5 mL via SUBCUTANEOUS
  Filled 2016-01-10: qty 5

## 2016-01-10 MED ORDER — LIDOCAINE HCL 1 % IJ SOLN
10.0000 mL | Freq: Once | INTRAMUSCULAR | Status: AC
  Administered 2016-01-10: 10 mL
  Filled 2016-01-10: qty 20

## 2016-01-10 MED ORDER — DOXYCYCLINE HYCLATE 100 MG PO CAPS: 100.0000 mg | ORAL_CAPSULE | Freq: Two times a day (BID) | ORAL | 0 refills | Status: DC

## 2016-01-10 NOTE — ED Provider Notes (Signed)
[facility]-EMERGENCY DEPT Provider Note   CSN: [id number] Arrival date & time: 01/10/16  1436  By signing my name below, I, Lauren Collins, attest that this documentation has been prepared under the direction and in the presence of  Arthor CaptainAbigail Kebin Maye, PA-C. Electronically Signed: Clovis PuAvnee Collins, ED Scribe. 01/10/16. 3:27 PM.   History   Chief Complaint Chief Complaint  Patient presents with  . Abscess    Left axillary    The history is provided by the patient. No language interpreter was used.   HPI Comments:  Lauren SitesHeavenly Collins is a 25 y.o. female, with a hx of frequent abscesses, who presents to the Emergency Department complaining of a moderate abscess under her left armpit. Pt denies any fevers. No alleviating factors noted. Pt denies any other physical complaints at this time.    Past Medical History:  Diagnosis Date  . Anemia   . Gestational diabetes    glyburide  . Hx of chlamydia infection   . Hx of varicella   . Kidney stones     Patient Active Problem List   Diagnosis Date Noted  . Pregnancy 07/09/2015  . Threatened preterm labor 05/09/2015    Past Surgical History:  Procedure Laterality Date  . CESAREAN SECTION N/A 07/09/2015   Procedure: CESAREAN SECTION;  Surgeon: Levi AlandMark E Anderson, MD;  Location: [facility] ORS;  Service: Obstetrics;  Laterality: N/A;  . NO PAST SURGERIES      OB History    Gravida Para Term Preterm AB Living   3 1 1   2 2    SAB TAB Ectopic Multiple Live Births   1 1   1 2        Home Medications    Prior to Admission medications   Medication Sig Start Date End Date Taking? Authorizing Provider  doxycycline (VIBRAMYCIN) 100 MG capsule Take 1 capsule (100 mg total) by mouth 2 (two) times daily. 01/10/16   Arthor CaptainAbigail Daleena Rotter, PA-C  glyBURIDE (DIABETA) 5 MG tablet Take 5 mg by mouth every morning. 06/12/15   Historical Provider, MD  IRON PO Take 1 tablet by mouth daily.    Historical Provider, MD  naproxen (NAPROSYN) 375 MG tablet Take 1 tablet (375 mg total) by  mouth 2 (two) times daily. 01/10/16   Arthor CaptainAbigail Austynn Pridmore, PA-C  oxyCODONE-acetaminophen (PERCOCET/ROXICET) 5-325 MG tablet Take 1 tablet by mouth every 4 (four) hours as needed for moderate pain. 07/11/15   Carrington ClampMichelle Horvath, MD  Prenatal Vit-Fe Fumarate-FA (PRENATAL MULTIVITAMIN) TABS tablet Take 1 tablet by mouth daily at 12 noon.     Historical Provider, MD    Family History Family History  Problem Relation Age of Onset  . Diabetes Father   . Kidney disease Paternal Grandmother   . Alcohol abuse Neg Hx   . Arthritis Neg Hx   . Asthma Neg Hx   . Birth defects Neg Hx   . Cancer Neg Hx   . COPD Neg Hx   . Depression Neg Hx   . Drug abuse Neg Hx   . Early death Neg Hx   . Hearing loss Neg Hx   . Heart disease Neg Hx   . Hyperlipidemia Neg Hx   . Hypertension Neg Hx   . Learning disabilities Neg Hx   . Mental illness Neg Hx   . Mental retardation Neg Hx   . Miscarriages / Stillbirths Neg Hx   . Stroke Neg Hx   . Vision loss Neg Hx   . Varicose Veins Neg Hx  Social History Social History  Substance Use Topics  . Smoking status: Never Smoker  . Smokeless tobacco: Never Used  . Alcohol use No     Allergies   Review of patient's allergies indicates no known allergies.   Review of Systems Review of Systems  Constitutional: Negative for fever.  Skin: Negative for color change.  All other systems reviewed and are negative.    Physical Exam Updated Vital Signs BP 124/79 (BP Location: Left Arm)   Pulse 83   Temp 97.9 F (36.6 C) (Oral)   Resp 17   Ht 5' (1.524 m)   Wt 78.9 kg   LMP 01/05/2016 (Approximate)   SpO2 100%   BMI 33.98 kg/m   Physical Exam  Constitutional: She is oriented to person, place, and time. She appears well-developed and well-nourished. No distress.  HENT:  Head: Normocephalic and atraumatic.  Eyes: Conjunctivae are normal. No scleral icterus.  Neck: Normal range of motion. Neck supple.  Cardiovascular: Normal rate, regular rhythm and  normal heart sounds.  Exam reveals no gallop and no friction rub.   No murmur heard. Pulmonary/Chest: Effort normal and breath sounds normal. No respiratory distress.  Abdominal: Soft. Bowel sounds are normal. She exhibits no distension and no mass. There is no tenderness. There is no guarding.  Neurological: She is alert and oriented to person, place, and time.  Skin: Skin is warm and dry. She is not diaphoretic. No erythema.  Left armpit significant abscess central fluctuance. No erythema. Moderate tenderness to palpation.Surrounding induration.   Nursing note and vitals reviewed.    ED Treatments / Results  DIAGNOSTIC STUDIES:  Oxygen Saturation is 99% on RA, normal by my interpretation.    COORDINATION OF CARE:  3:02 PM Discussed treatment plan with pt at bedside and pt agreed to plan.  Labs (all labs ordered are listed, but only abnormal results are displayed) Labs Reviewed - No data to display  EKG  EKG Interpretation None       Radiology No results found.  Procedures .Marland KitchenIncision and Drainage Date/Time: 01/10/2016 5:53 PM Performed by: Arthor Captain Authorized by: Arthor Captain   Consent:    Consent obtained:  Verbal   Alternatives discussed:  No treatment and observation Location:    Type:  Abscess   Size:  6cm   Location: L axilla. Pre-procedure details:    Skin preparation:  Betadine Anesthesia (see MAR for exact dosages):    Anesthesia method:  Local infiltration   Local anesthetic:  Lidocaine 2% w/o epi and sodium bicarbonate Procedure type:    Complexity:  Simple Procedure details:    Needle aspiration: no     Incision types:  Stab incision and single straight   Incision depth:  Dermal   Scalpel blade:  11   Wound management:  Probed and deloculated, irrigated with saline and extensive cleaning   Drainage:  Bloody and purulent   Drainage amount:  Moderate   Wound treatment:  Wound left open   Packing materials:  None Post-procedure  details:    Patient tolerance of procedure:  Tolerated well, no immediate complications   (including critical care time)  Medications Ordered in ED Medications  lidocaine (XYLOCAINE) 1 % (with pres) injection 10 mL (10 mLs Infiltration Given by Other 01/10/16 1515)  sodium bicarbonate (NEUT) 4 % injection 5 mL (5 mLs Subcutaneous Given by Other 01/10/16 1531)     Initial Impression / Assessment and Plan / ED Course  I have reviewed the triage vital signs  and the nursing notes.  Pertinent labs & imaging results that were available during my care of the patient were reviewed by me and considered in my medical decision making (see chart for details).  Clinical Course    Patient with skin abscess. Incision and drainage performed in the ED today.  Abscess was not large enough to warrant packing. Supportive care and return precautions discussed.  Pt sent home with doxycycline. The patient appears reasonably screened and/or stabilized for discharge and I doubt any other emergent medical condition requiring further screening, evaluation, or treatment in the ED prior to discharge.   Final Clinical Impressions(s) / ED Diagnoses   Final diagnoses:  Axillary abscess  Hidradenitis suppurativa    New Prescriptions Discharge Medication List as of 01/10/2016  4:48 PM    START taking these medications   Details  doxycycline (VIBRAMYCIN) 100 MG capsule Take 1 capsule (100 mg total) by mouth 2 (two) times daily., Starting Fri 01/10/2016, Print    naproxen (NAPROSYN) 375 MG tablet Take 1 tablet (375 mg total) by mouth 2 (two) times daily., Starting Fri 01/10/2016, Print      I personally performed the services described in this documentation, which was scribed in my presence. The recorded information has been reviewed and is accurate.       Arthor Captain, PA-C 01/10/16 1755    Alvira Monday, MD 01/12/16 2135

## 2016-01-10 NOTE — Discharge Instructions (Signed)
Follow up with the dermatologist. Return for any worsening symptoms or concerns

## 2016-01-10 NOTE — ED Triage Notes (Signed)
Pt to ED for recurrent abcess. Pt states she gets them frequently. Pt said she tried to do it herself and wasn't able to.

## 2016-01-20 ENCOUNTER — Emergency Department (HOSPITAL_COMMUNITY)
Admission: EM | Admit: 2016-01-20 | Discharge: 2016-01-20 | Disposition: A | Discharge status: Home / Self Care | Payer: 59 | Attending: Emergency Medicine | Admitting: Emergency Medicine

## 2016-01-20 ENCOUNTER — Encounter (HOSPITAL_COMMUNITY): Payer: Self-pay | Admitting: Emergency Medicine

## 2016-01-20 DIAGNOSIS — Z791 Long term (current) use of non-steroidal anti-inflammatories (NSAID): Secondary | ICD-10-CM | POA: Diagnosis not present

## 2016-01-20 DIAGNOSIS — L0291 Cutaneous abscess, unspecified: Secondary | ICD-10-CM

## 2016-01-20 DIAGNOSIS — L02412 Cutaneous abscess of left axilla: Secondary | ICD-10-CM | POA: Diagnosis present

## 2016-01-20 DIAGNOSIS — Z79899 Other long term (current) drug therapy: Secondary | ICD-10-CM | POA: Insufficient documentation

## 2016-01-20 DIAGNOSIS — Z7984 Long term (current) use of oral hypoglycemic drugs: Secondary | ICD-10-CM | POA: Insufficient documentation

## 2016-01-20 MED ORDER — LIDOCAINE-EPINEPHRINE (PF) 1 %-1:200000 IJ SOLN
INTRAMUSCULAR | Status: AC
  Administered 2016-01-20: 30 mL
  Filled 2016-01-20: qty 30

## 2016-01-20 MED ORDER — LIDOCAINE-EPINEPHRINE 2 %-1:100000 IJ SOLN
20.0000 mL | Freq: Once | INTRAMUSCULAR | Status: DC
  Filled 2016-01-20: qty 20

## 2016-01-20 NOTE — ED Triage Notes (Signed)
Patient states that she was seen here a weeks ago for left axillary abscess and states that thinks it has gotten bigger and still sore.

## 2016-01-20 NOTE — ED Provider Notes (Signed)
[facility]-EMERGENCY DEPT Provider Note   CSN: [id number] Arrival date & time: 01/20/16  1811  By signing my name below, I, Vista Minkobert Ross, attest that this documentation has been prepared under the direction and in the presence of Teressa LowerVrinda Lucilla Petrenko NP.  Electronically Signed: Vista Minkobert Ross, ED Scribe. 01/20/16. 7:40 PM.   History   Chief Complaint Chief Complaint  Patient presents with  . Abscess    HPI HPI Comments: Sharilyn SitesHeavenly Lazarz is a 25 y.o. female who presents to the Emergency Department complaining of gradually worsening, painful abscess to her left axillary region that has been persistent for the past month. Pt was seen here 10 days ago for concerns of the abscess and had an I&D performed on it. Pt states that they were only able to remove a small amount of puss from it. Pt was discharged home with abx but not finish the abx because "they weren't helping". She states that the area has since grown and has become extremely painful. Pt states that the area feels okay during the day but reports worst pain at night and difficulty staying asleep due to the pain. She denies any fever.   The history is provided by the patient. No language interpreter was used.    Past Medical History:  Diagnosis Date  . Anemia   . Gestational diabetes    glyburide  . Hx of chlamydia infection   . Hx of varicella   . Kidney stones     Patient Active Problem List   Diagnosis Date Noted  . Pregnancy 07/09/2015  . Threatened preterm labor 05/09/2015    Past Surgical History:  Procedure Laterality Date  . CESAREAN SECTION N/A 07/09/2015   Procedure: CESAREAN SECTION;  Surgeon: Levi AlandMark E Anderson, MD;  Location: [facility];  Service: Obstetrics;  Laterality: N/A;  . NO PAST SURGERIES      OB History    Gravida Para Term Preterm AB Living   3 1 1   2 2    SAB TAB Ectopic Multiple Live Births   1 1   1 2        Home Medications    Prior to Admission medications   Medication Sig Start Date End Date Taking?  Authorizing Provider  doxycycline (VIBRAMYCIN) 100 MG capsule Take 1 capsule (100 mg total) by mouth 2 (two) times daily. 01/10/16   Arthor CaptainAbigail Harris, PA-C  glyBURIDE (DIABETA) 5 MG tablet Take 5 mg by mouth every morning. 06/12/15   Historical Provider, MD  IRON PO Take 1 tablet by mouth daily.    Historical Provider, MD  naproxen (NAPROSYN) 375 MG tablet Take 1 tablet (375 mg total) by mouth 2 (two) times daily. 01/10/16   Arthor CaptainAbigail Harris, PA-C  oxyCODONE-acetaminophen (PERCOCET/ROXICET) 5-325 MG tablet Take 1 tablet by mouth every 4 (four) hours as needed for moderate pain. 07/11/15   Carrington ClampMichelle Horvath, MD  Prenatal Vit-Fe Fumarate-FA (PRENATAL MULTIVITAMIN) TABS tablet Take 1 tablet by mouth daily at 12 noon.     Historical Provider, MD    Family History Family History  Problem Relation Age of Onset  . Diabetes Father   . Kidney disease Paternal Grandmother   . Alcohol abuse Neg Hx   . Arthritis Neg Hx   . Asthma Neg Hx   . Birth defects Neg Hx   . Cancer Neg Hx   . COPD Neg Hx   . Depression Neg Hx   . Drug abuse Neg Hx   . Early death Neg Hx   . Hearing  loss Neg Hx   . Heart disease Neg Hx   . Hyperlipidemia Neg Hx   . Hypertension Neg Hx   . Learning disabilities Neg Hx   . Mental illness Neg Hx   . Mental retardation Neg Hx   . Miscarriages / Stillbirths Neg Hx   . Stroke Neg Hx   . Vision loss Neg Hx   . Varicose Veins Neg Hx      Social History Social History  Substance Use Topics  . Smoking status: Never Smoker  . Smokeless tobacco: Never Used  . Alcohol use No     Allergies   Review of patient's allergies indicates no known allergies.   Review of Systems Review of Systems  Constitutional: Negative for fever.  Skin: Positive for wound (abscess to left axillary region).     Physical Exam Updated Vital Signs BP 126/75 (BP Location: Right Arm)   Pulse 69   Temp 98.6 F (37 C) (Oral)   Resp 17   Ht 5' (1.524 m)   Wt 173 lb (78.5 kg)   LMP 01/05/2016  (Approximate)   SpO2 99%   BMI 33.79 kg/m   Physical Exam  Constitutional: She is oriented to person, place, and time. She appears well-developed and well-nourished. No distress.  HENT:  Head: Normocephalic and atraumatic.  Neck: Normal range of motion.  Pulmonary/Chest: Effort normal.  Neurological: She is alert and oriented to person, place, and time.  Skin: Skin is warm and dry. She is not diaphoretic.  Fluctuance and firmness noted to the left axilla. No redness or warmth noted  Psychiatric: She has a normal mood and affect. Judgment normal.  Nursing note and vitals reviewed.    ED Treatments / Results  DIAGNOSTIC STUDIES: Oxygen Saturation is 99% on RA, normal by my interpretation.  COORDINATION OF CARE: 7:38 PM-Will drain. Discussed treatment plan with pt at bedside and pt agreed to plan.   Labs (all labs ordered are listed, but only abnormal results are displayed) Labs Reviewed - No data to display  EKG  EKG Interpretation None       Radiology No results found.  Procedures .Marland KitchenIncision and Drainage Date/Time: 01/20/2016 8:01 PM Performed by: Teressa Lower Authorized by: Teressa Lower   Consent:    Consent obtained:  Verbal   Consent given by:  Patient Location:    Type:  Abscess   Location:  Upper extremity   Upper extremity location: axilla. Pre-procedure details:    Skin preparation:  Betadine Anesthesia (see MAR for exact dosages):    Anesthesia method:  Local infiltration   Local anesthetic:  Lidocaine 1% WITH epi Procedure type:    Complexity:  Simple Procedure details:    Needle aspiration: no     Incision types:  Single straight   Scalpel blade:  11   Drainage:  Bloody and purulent   Drainage amount:  Moderate   Packing materials:  1/4 in gauze Post-procedure details:    Patient tolerance of procedure:  Tolerated well, no immediate complications   (including critical care time)  Medications Ordered in ED Medications    lidocaine-EPINEPHrine (XYLOCAINE W/EPI) 2 %-1:100000 (with pres) injection 20 mL (not administered)     Initial Impression / Assessment and Plan / ED Course  I have reviewed the triage vital signs and the nursing notes.  Pertinent labs & imaging results that were available during my care of the patient were reviewed by me and considered in my medical decision making (see chart for details).  Clinical Course   I&D done. Don't think antibiotics are needed. Discussed supportive care at home with pt  Final Clinical Impressions(s) / ED Diagnoses   Final diagnoses:  None    New Prescriptions New Prescriptions   No medications on file  .I personally performed the services described in this documentation, which was scribed in my presence. The recorded information has been reviewed and is accurate.     Teressa Lower, NP 01/20/16 2003    Teressa Lower, NP 01/20/16 2003    Mancel Bale, MD 01/24/16 [phone]

## 2016-01-20 NOTE — ED Notes (Signed)
PT DISCHARGED. INSTRUCTIONS GIVEN. AAOX4. PT IN NO APPARENT DISTRESS. THE OPPORTUNITY TO ASK QUESTIONS WAS PROVIDED. 

## 2016-08-27 ENCOUNTER — Encounter (HOSPITAL_COMMUNITY): Payer: Self-pay | Admitting: Obstetrics and Gynecology

## 2016-09-01 ENCOUNTER — Encounter (HOSPITAL_COMMUNITY): Payer: Self-pay | Admitting: Emergency Medicine

## 2016-09-01 ENCOUNTER — Emergency Department (HOSPITAL_COMMUNITY)
Admission: EM | Admit: 2016-09-01 | Discharge: 2016-09-01 | Disposition: A | Discharge status: Home / Self Care | Payer: 59 | Attending: Emergency Medicine | Admitting: Emergency Medicine

## 2016-09-01 DIAGNOSIS — R1110 Vomiting, unspecified: Secondary | ICD-10-CM | POA: Diagnosis present

## 2016-09-01 DIAGNOSIS — R109 Unspecified abdominal pain: Secondary | ICD-10-CM | POA: Insufficient documentation

## 2016-09-01 DIAGNOSIS — R112 Nausea with vomiting, unspecified: Secondary | ICD-10-CM | POA: Insufficient documentation

## 2016-09-01 DIAGNOSIS — Z79899 Other long term (current) drug therapy: Secondary | ICD-10-CM | POA: Insufficient documentation

## 2016-09-01 DIAGNOSIS — R197 Diarrhea, unspecified: Secondary | ICD-10-CM | POA: Insufficient documentation

## 2016-09-01 LAB — CBC
HEMATOCRIT: 35.9 % — AB (ref 36.0–46.0)
Hemoglobin: 11.3 g/dL — ABNORMAL LOW (ref 12.0–15.0)
MCH: 23.9 pg — AB (ref 26.0–34.0)
MCHC: 31.5 g/dL (ref 30.0–36.0)
MCV: 75.9 fL — AB (ref 78.0–100.0)
Platelets: 324 10*3/uL (ref 150–400)
RBC: 4.73 MIL/uL (ref 3.87–5.11)
RDW: 17 % — AB (ref 11.5–15.5)
WBC: 4.4 10*3/uL (ref 4.0–10.5)

## 2016-09-01 LAB — URINALYSIS, ROUTINE W REFLEX MICROSCOPIC
Bilirubin Urine: NEGATIVE
Glucose, UA: NEGATIVE mg/dL
Hgb urine dipstick: NEGATIVE
KETONES UR: 20 mg/dL — AB
LEUKOCYTES UA: NEGATIVE
NITRITE: NEGATIVE
PH: 6 (ref 5.0–8.0)
PROTEIN: NEGATIVE mg/dL
Specific Gravity, Urine: 1.014 (ref 1.005–1.030)

## 2016-09-01 LAB — I-STAT CG4 LACTIC ACID, ED: LACTIC ACID, VENOUS: 0.88 mmol/L (ref 0.5–1.9)

## 2016-09-01 LAB — COMPREHENSIVE METABOLIC PANEL
ALBUMIN: 4.1 g/dL (ref 3.5–5.0)
ALT: 12 U/L — AB (ref 14–54)
AST: 19 U/L (ref 15–41)
Alkaline Phosphatase: 90 U/L (ref 38–126)
Anion gap: 8 (ref 5–15)
BUN: 12 mg/dL (ref 6–20)
CHLORIDE: 104 mmol/L (ref 101–111)
CO2: 27 mmol/L (ref 22–32)
CREATININE: 0.82 mg/dL (ref 0.44–1.00)
Calcium: 9.1 mg/dL (ref 8.9–10.3)
GFR calc Af Amer: >60 mL/min (ref >60)
GFR calc non Af Amer: >60 mL/min (ref >60)
GLUCOSE: 89 mg/dL (ref 65–99)
POTASSIUM: 3.5 mmol/L (ref 3.5–5.1)
SODIUM: 139 mmol/L (ref 135–145)
Total Bilirubin: 0.6 mg/dL (ref 0.3–1.2)
Total Protein: 7.4 g/dL (ref 6.5–8.1)

## 2016-09-01 LAB — POC URINE PREG, ED: Preg Test, Ur: NEGATIVE

## 2016-09-01 LAB — LIPASE, BLOOD: LIPASE: 16 U/L (ref 11–51)

## 2016-09-01 MED ORDER — SODIUM CHLORIDE 0.9 % IV BOLUS (SEPSIS)
1000.0000 mL | Freq: Once | INTRAVENOUS | Status: AC
  Administered 2016-09-01: 1000 mL via INTRAVENOUS

## 2016-09-01 MED ORDER — ONDANSETRON HCL 4 MG PO TABS: 4.0000 mg | ORAL_TABLET | Freq: Three times a day (TID) | ORAL | 0 refills | Status: DC | PRN

## 2016-09-01 MED ORDER — HYDROCODONE-ACETAMINOPHEN 5-325 MG PO TABS: 1.0000 | ORAL_TABLET | ORAL | 0 refills | Status: DC | PRN

## 2016-09-01 MED ORDER — ONDANSETRON HCL 4 MG/2ML IJ SOLN
4.0000 mg | Freq: Once | INTRAMUSCULAR | Status: AC
  Administered 2016-09-01: 4 mg via INTRAVENOUS
  Filled 2016-09-01: qty 2

## 2016-09-01 NOTE — ED Triage Notes (Signed)
Per pt, states she has been vomiting and having diarrhea for 14 hours-states 10/10 abdominal pain

## 2016-09-01 NOTE — Discharge Instructions (Signed)
Please take your nausea medicine and pain medicine to help with your symptoms. Please follow-up with your primary care physician for further management of your symptoms. Please stay hydrated. If any symptoms change or worsen, please return to the nearest emergency department.

## 2016-09-01 NOTE — ED Notes (Addendum)
LACTIC NOT NEEDED per Dr. Rush Landmarkegeler

## 2016-09-01 NOTE — ED Notes (Signed)
Pt cannot use restroom at this time, aware urine specimen is needed.  

## 2016-09-01 NOTE — ED Provider Notes (Signed)
[facility] DEPT Provider Note   CSN: [id number] Arrival date & time: 09/01/16  4098     History   Chief Complaint Chief Complaint  Patient presents with  . Emesis    HPI Lauren Collins is a 26 y.o. female.  The history is provided by the patient and medical records.  Emesis   This is a new problem. The current episode started yesterday. The problem occurs continuously. The problem has not changed since onset.The emesis has an appearance of stomach contents. There has been no fever. Associated symptoms include abdominal pain and diarrhea. Pertinent negatives include no chills, no cough, no fever, no headaches and no URI.    Past Medical History:  Diagnosis Date  . Anemia   . Gestational diabetes    glyburide  . Hx of chlamydia infection   . Hx of varicella   . Kidney stones     Patient Active Problem List   Diagnosis Date Noted  . Pregnancy 07/09/2015  . Threatened preterm labor 05/09/2015    Past Surgical History:  Procedure Laterality Date  . CESAREAN SECTION MULTI-GESTATIONAL  07/09/2015   Procedure: CESAREAN SECTION MULTI-GESTATIONAL;  Surgeon: Levi Aland, MD;  Location: [facility];  Service: Obstetrics;;  . NO PAST SURGERIES      OB History    Gravida Para Term Preterm AB Living   3 1 1   2 2    SAB TAB Ectopic Multiple Live Births   1 1   1 2        Home Medications    Prior to Admission medications   Medication Sig Start Date End Date Taking? Authorizing Provider  doxycycline (VIBRAMYCIN) 100 MG capsule Take 1 capsule (100 mg total) by mouth 2 (two) times daily. Patient not taking: Reported on 09/01/2016 01/10/16   Arthor Captain, PA-C  naproxen (NAPROSYN) 375 MG tablet Take 1 tablet (375 mg total) by mouth 2 (two) times daily. Patient not taking: Reported on 09/01/2016 01/10/16   Arthor Captain, PA-C  oxyCODONE-acetaminophen (PERCOCET/ROXICET) 5-325 MG tablet Take 1 tablet by mouth every 4 (four) hours as needed for moderate pain. Patient not taking:  Reported on 09/01/2016 07/11/15   Carrington Clamp, MD    Family History Family History  Problem Relation Age of Onset  . Diabetes Father   . Kidney disease Paternal Grandmother   . Alcohol abuse Neg Hx   . Arthritis Neg Hx   . Asthma Neg Hx   . Birth defects Neg Hx   . Cancer Neg Hx   . COPD Neg Hx   . Depression Neg Hx   . Drug abuse Neg Hx   . Early death Neg Hx   . Hearing loss Neg Hx   . Heart disease Neg Hx   . Hyperlipidemia Neg Hx   . Hypertension Neg Hx   . Learning disabilities Neg Hx   . Mental illness Neg Hx   . Mental retardation Neg Hx   . Miscarriages / Stillbirths Neg Hx   . Stroke Neg Hx   . Vision loss Neg Hx   . Varicose Veins Neg Hx     Social History Social History  Substance Use Topics  . Smoking status: Never Smoker  . Smokeless tobacco: Never Used  . Alcohol use No     Allergies   Patient has no known allergies.   Review of Systems Review of Systems  Constitutional: Positive for fatigue. Negative for chills, diaphoresis and fever.  HENT: Negative for congestion and rhinorrhea.  Respiratory: Negative for cough, chest tightness, shortness of breath and wheezing.   Cardiovascular: Negative for chest pain, palpitations and leg swelling.  Gastrointestinal: Positive for abdominal pain, diarrhea, nausea and vomiting. Negative for constipation.  Genitourinary: Negative for dysuria, flank pain, frequency, vaginal bleeding and vaginal discharge.  Musculoskeletal: Negative for back pain, neck pain and neck stiffness.  Skin: Negative for rash and wound.  Neurological: Negative for light-headedness, numbness and headaches.  Psychiatric/Behavioral: Negative for agitation.  All other systems reviewed and are negative.    Physical Exam Updated Vital Signs BP 119/61 (BP Location: Left Arm)   Pulse 76   Temp 98.2 F (36.8 C) (Oral)   Resp 16   LMP 08/25/2016   SpO2 100%   Physical Exam  Constitutional: She appears well-developed and  well-nourished. No distress.  HENT:  Head: Normocephalic and atraumatic.  Mouth/Throat: Oropharynx is clear and moist.  Eyes: Conjunctivae and EOM are normal. Pupils are equal, round, and reactive to light.  Neck: Normal range of motion. Neck supple.  Cardiovascular: Normal rate, regular rhythm and intact distal pulses.   No murmur heard. Pulmonary/Chest: Effort normal and breath sounds normal. No stridor. No respiratory distress. She has no wheezes. She exhibits no tenderness.  Abdominal: Soft. There is tenderness (mild). There is no rigidity and no CVA tenderness.    Musculoskeletal: She exhibits no edema or tenderness.  Neurological: She is alert. No sensory deficit. She exhibits normal muscle tone.  Skin: Skin is warm and dry. Capillary refill takes less than 2 seconds. No rash noted. No erythema.  Psychiatric: She has a normal mood and affect.  Nursing note and vitals reviewed.    ED Treatments / Results  Labs (all labs ordered are listed, but only abnormal results are displayed) Labs Reviewed  COMPREHENSIVE METABOLIC PANEL - Abnormal; Notable for the following:       Result Value   ALT 12 (*)    All other components within normal limits  CBC - Abnormal; Notable for the following:    Hemoglobin 11.3 (*)    HCT 35.9 (*)    MCV 75.9 (*)    MCH 23.9 (*)    RDW 17.0 (*)    All other components within normal limits  URINALYSIS, ROUTINE W REFLEX MICROSCOPIC - Abnormal; Notable for the following:    APPearance HAZY (*)    Ketones, ur 20 (*)    All other components within normal limits  LIPASE, BLOOD  POC URINE PREG, ED  I-STAT CG4 LACTIC ACID, ED  I-STAT CG4 LACTIC ACID, ED    EKG  EKG Interpretation None       Radiology No results found.  Procedures Procedures (including critical care time)  Medications Ordered in ED Medications  sodium chloride 0.9 % bolus 1,000 mL (0 mLs Intravenous Stopped 09/01/16 1320)  sodium chloride 0.9 % bolus 1,000 mL (0 mLs  Intravenous Stopped 09/01/16 1320)  ondansetron (ZOFRAN) injection 4 mg (4 mg Intravenous Given 09/01/16 1019)     Initial Impression / Assessment and Plan / ED Course  I have reviewed the triage vital signs and the nursing notes.  Pertinent labs & imaging results that were available during my care of the patient were reviewed by me and considered in my medical decision making (see chart for details).     Lauren Collins is a 26 y.o. female with a past medical history significant for kidney stones who presents with a two-day history of nausea, vomiting, diarrhea, and abdominal pain. Patient reports  that she began feeling nauseous yesterday at work and progress to nonstop vomiting overnight. Patient denies any blood in her emesis or diarrhea. She has had multiple pieces of diarrhea today. She describes she is having a abdominal pain across her upper abdomen that is a 8 out of 10 severity currently. Is up to 10 out of 10 at a sports. It is a cramping type pain. She says it feels "very different than my kidney stones". She has not taken any medicine to help her symptoms and has not been keeping anything down orally overnight. Patient reports feeling lightheaded when she was walking due to feeling dehydrated. She denies other complaints including no chest pain, shortness of breath, palpitations or trauma. She denies any neurologic deficits.  History and exam are seen above. On exam, patient's abdomen is mildly tender to palpation. No lower abdominal tenderness. No CVA tenderness. No other abnormalities on exam including clear lungs and nontender chest.  Patient will have laboratory testing to look for cause of her nausea and vomiting. Suspect viral gastroenteritis with associated diarrhea. Patient given pain medicine, nausea medicine, and fluids during workup. Anticipate Reassessment following testing.  Labs reveal no significant abnormalities. Symptoms greatly improved with medications and fluids.  PT  felt to likely have gastroenteritis. Pt given prescription for pain and nausea meds and instructions to follow up with PCP. PT understood return precautions and instructions to stay hydrated. PTP dischaged in good condition.      Final Clinical Impressions(s) / ED Diagnoses   Final diagnoses:  Nausea vomiting and diarrhea  Abdominal pain, unspecified abdominal location    New Prescriptions New Prescriptions   No medications on file    Clinical Impression: 1. Nausea vomiting and diarrhea   2. Abdominal pain, unspecified abdominal location     Disposition: Discharge  Condition: Good  I have discussed the results, Dx and Tx plan with the pt(& family if present). He/she/they expressed understanding and agree(s) with the plan. Discharge instructions discussed at great length. Strict return precautions discussed and pt &/or family have verbalized understanding of the instructions. No further questions at time of discharge.    Discharge Medication List as of 09/01/2016  4:56 PM    START taking these medications   Details  HYDROcodone-acetaminophen (NORCO/VICODIN) 5-325 MG tablet Take 1 tablet by mouth every 4 (four) hours as needed., Starting Tue 09/01/2016, Print    ondansetron (ZOFRAN) 4 MG tablet Take 1 tablet (4 mg total) by mouth every 8 (eight) hours as needed for nausea or vomiting., Starting Tue 09/01/2016, Print        [facility]: [facility] [address] [place] [id number] [phone] Schedule an appointment as soon as possible for a visit    [facility] [id number] [place] [phone]  If symptoms worsen     Candace Ramus, Canary Brimhristopher J, MD 09/01/16 2013

## 2016-09-05 IMAGING — US US OB EACH ADDL GEST<[ID]
1 series · 13 of 28 positions shown · non-contrast
Comparison: CT of the abdomen and pelvis from 05/04/2012

CLINICAL DATA: Acute onset of intermittent cramping. Initial
encounter.

EXAM:
TWIN OBSTETRIC <14WK US AND TRANSVAGINAL OB US

[Series 1: us ob comp less 14 wk · 13 of 56 slices shown]
[im 3/56]
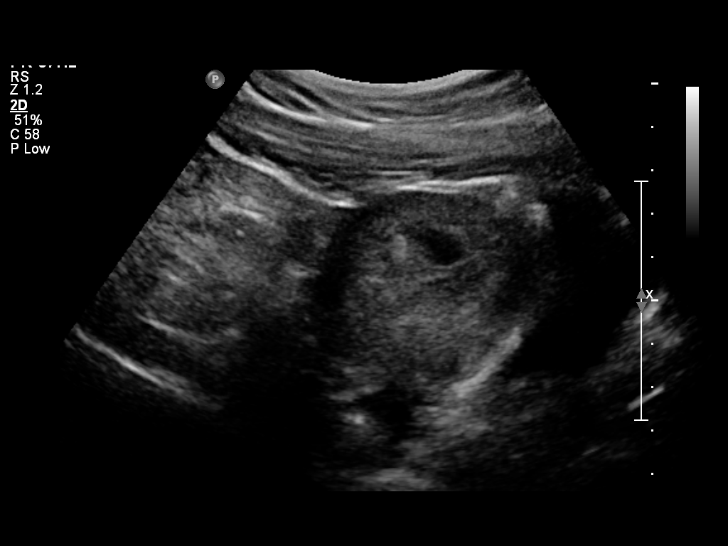
[im 7/56]
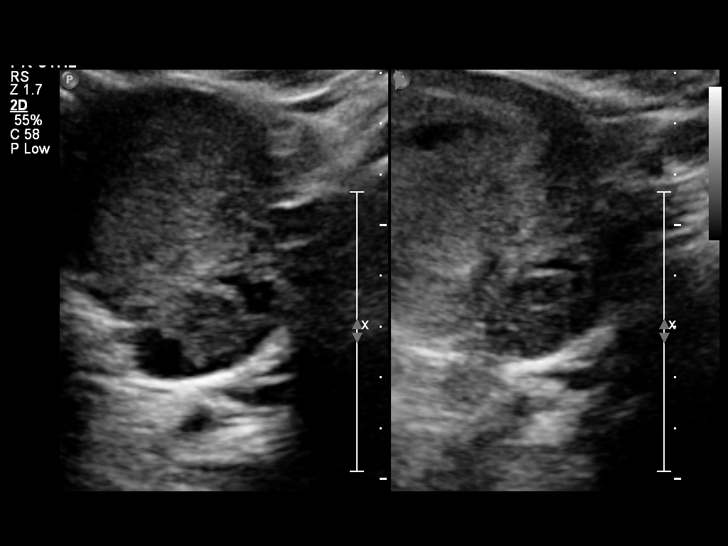
[im 11/56]
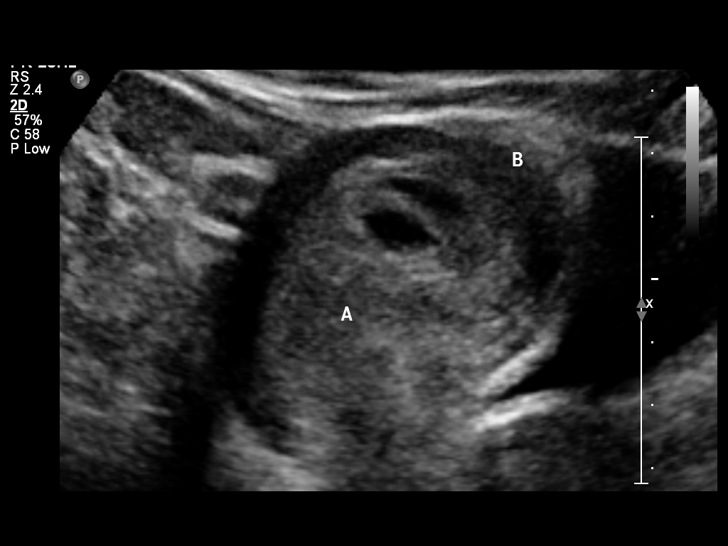
[im 15/56]
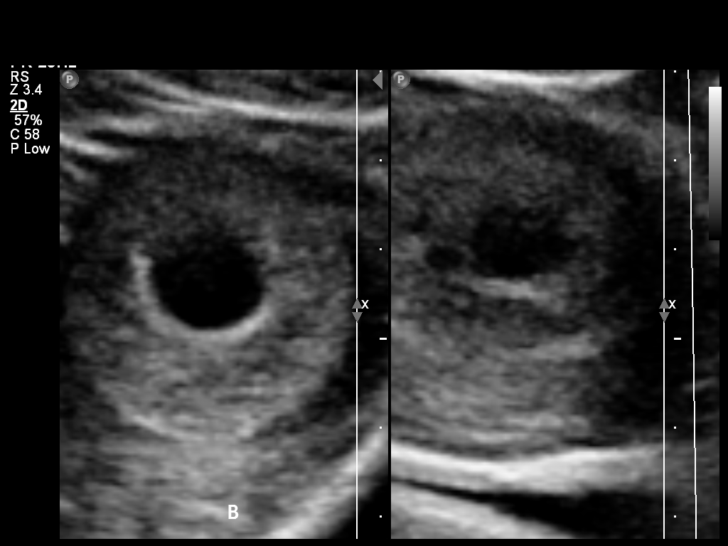
[im 19/56]
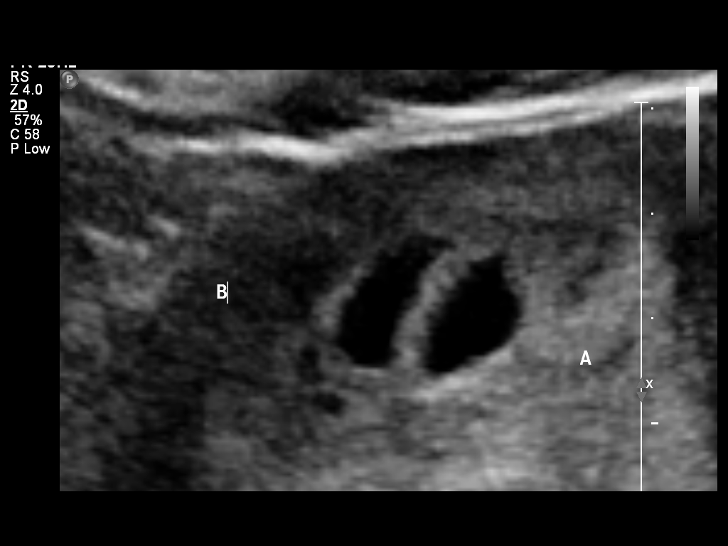
[im 23/56]
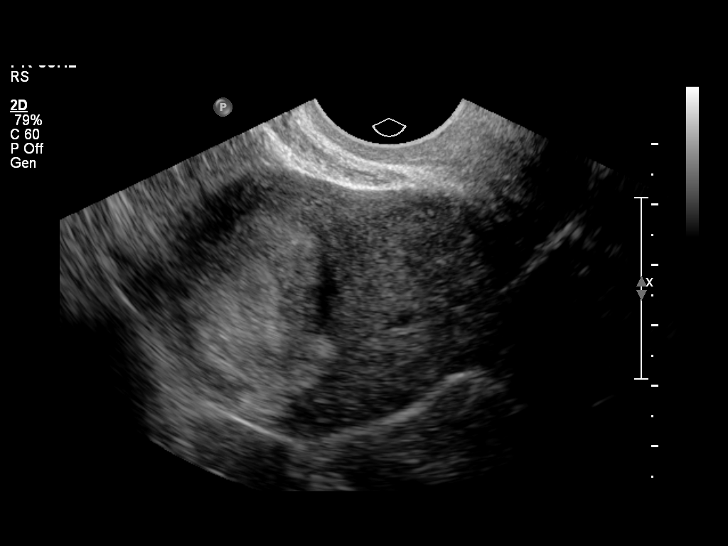
[im 29/56]
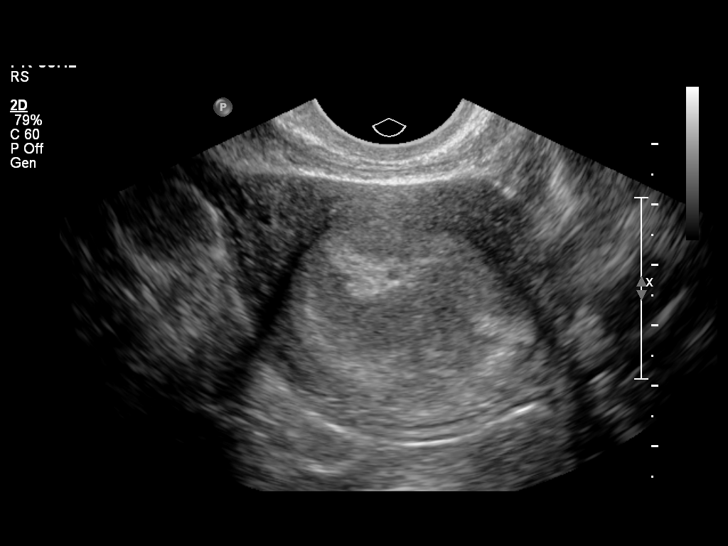
[im 33/56]
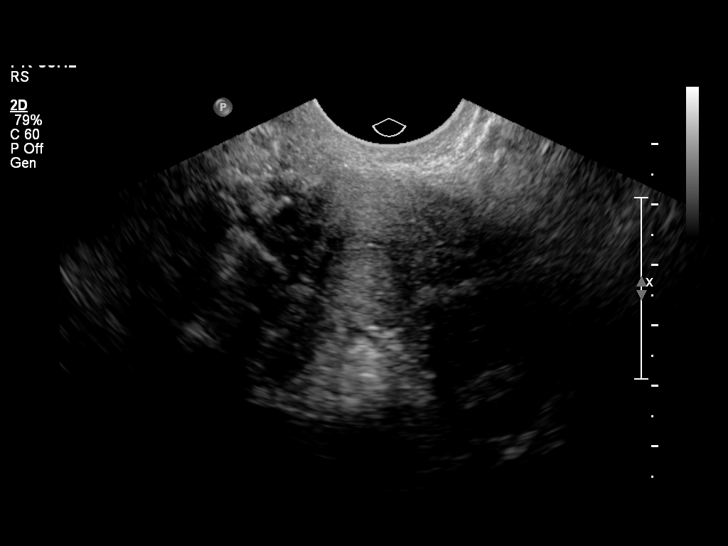
[im 37/56]
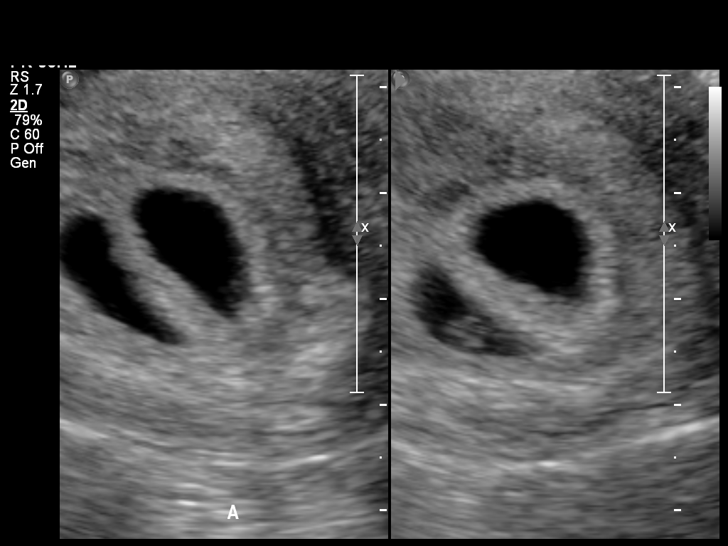
[im 41/56]
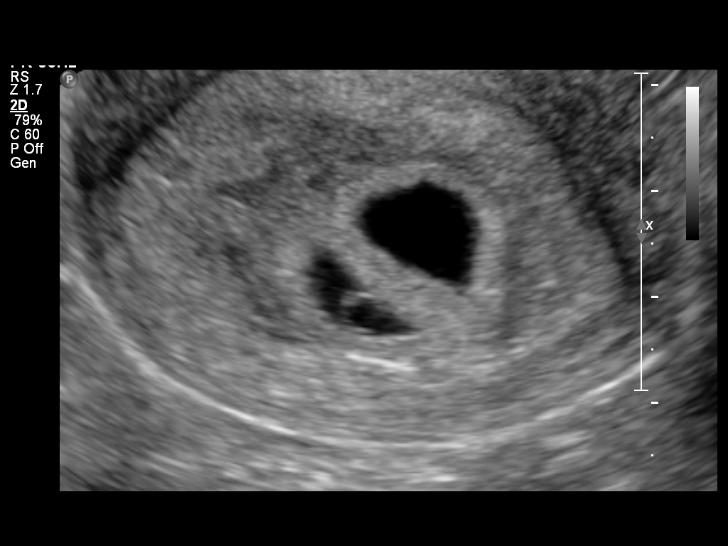
[im 45/56]
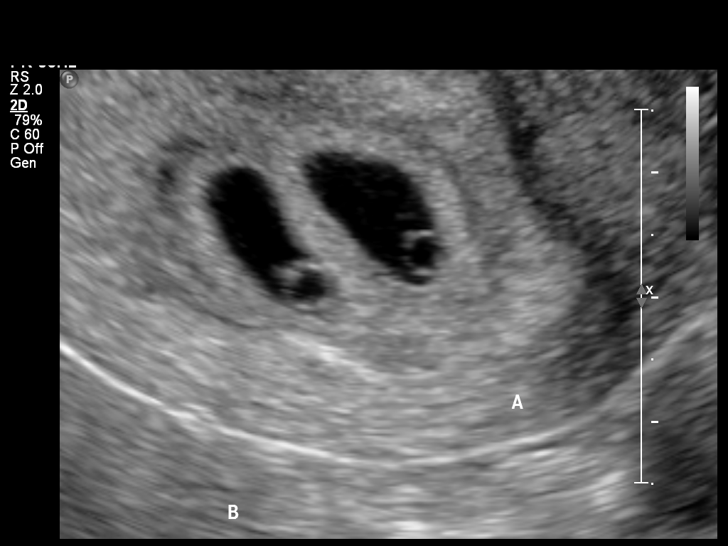
[im 49/56]
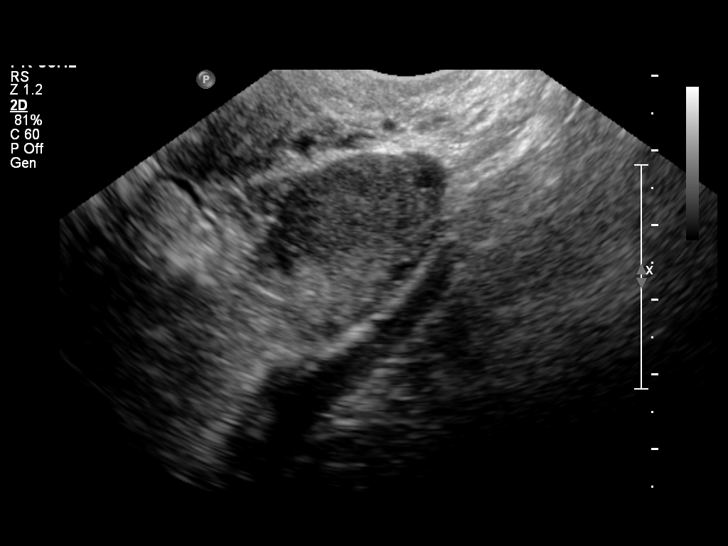
[im 53/56]
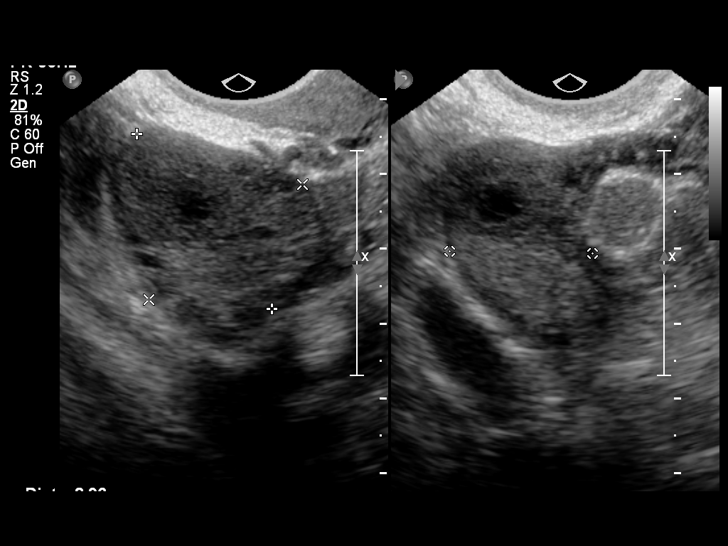

[13 of 28 positions shown; findings below may reference images not displayed]

FINDINGS: TWIN 1

Intrauterine gestational sac: Visualized/normal in shape.

Yolk sac:  Yes

Embryo:  No

Cardiac Activity: N/A

MSD: 1.1 cm   5 w   6  d

TWIN 2

Intrauterine gestational sac: Visualized/normal in shape.

Yolk sac:  Yes

Embryo:  No

Cardiac Activity: N/A

MSD: 1.1 cm   5 w   6  d

Maternal uterus/adnexae: No subchorionic hemorrhage is noted. The
uterus is otherwise unremarkable in appearance. The pregnancy is
diamniotic dichorionic in nature.

The ovaries are unremarkable in appearance. The right ovary measures
3.0 x 2.6 x 1.9 cm, while the left ovary measures 3.4 x 2.2 x
cm. No suspicious adnexal masses are seen; there is no evidence for
ovarian torsion.

No free fluid is seen within the pelvic cul-de-sac.
IMPRESSION: Two intrauterine gestational sacs noted, both with yolk sacs. No
embryos are yet seen. The mean sac diameter of 1.1 cm for both
gestational sacs corresponds to gestational ages of 5 weeks 6 days,
which match the gestational age by LMP, reflecting an estimated date
of delivery July 30, 2015. The pregnancy is diamniotic
dichorionic in nature.

## 2018-02-20 ENCOUNTER — Emergency Department (HOSPITAL_COMMUNITY)
Admission: EM | Admit: 2018-02-20 | Discharge: 2018-02-20 | Disposition: A | Discharge status: Home / Self Care | Payer: 59 | Attending: Emergency Medicine | Admitting: Emergency Medicine

## 2018-02-20 ENCOUNTER — Other Ambulatory Visit: Payer: Self-pay

## 2018-02-20 ENCOUNTER — Emergency Department (HOSPITAL_COMMUNITY): Payer: 59

## 2018-02-20 ENCOUNTER — Encounter (HOSPITAL_COMMUNITY): Payer: Self-pay

## 2018-02-20 DIAGNOSIS — R509 Fever, unspecified: Secondary | ICD-10-CM | POA: Diagnosis present

## 2018-02-20 DIAGNOSIS — J189 Pneumonia, unspecified organism: Secondary | ICD-10-CM | POA: Insufficient documentation

## 2018-02-20 DIAGNOSIS — J181 Lobar pneumonia, unspecified organism: Secondary | ICD-10-CM

## 2018-02-20 LAB — INFLUENZA PANEL BY PCR (TYPE A & B)
Influenza A By PCR: NEGATIVE
Influenza B By PCR: NEGATIVE

## 2018-02-20 MED ORDER — AZITHROMYCIN 250 MG PO TABS
500.0000 mg | ORAL_TABLET | Freq: Once | ORAL | Status: AC
  Administered 2018-02-20: 500 mg via ORAL
  Filled 2018-02-20: qty 2

## 2018-02-20 MED ORDER — KETOROLAC TROMETHAMINE 30 MG/ML IJ SOLN
30.0000 mg | Freq: Once | INTRAMUSCULAR | Status: AC
  Administered 2018-02-20: 30 mg via INTRAVENOUS
  Filled 2018-02-20: qty 1

## 2018-02-20 MED ORDER — ONDANSETRON HCL 4 MG/2ML IJ SOLN
4.0000 mg | Freq: Once | INTRAMUSCULAR | Status: AC
  Administered 2018-02-20: 4 mg via INTRAVENOUS
  Filled 2018-02-20: qty 2

## 2018-02-20 MED ORDER — AMOXICILLIN-POT CLAVULANATE 875-125 MG PO TABS
1.0000 | ORAL_TABLET | Freq: Once | ORAL | Status: AC
  Administered 2018-02-20: 1 via ORAL
  Filled 2018-02-20: qty 1

## 2018-02-20 MED ORDER — AZITHROMYCIN 250 MG PO TABS: 250.0000 mg | ORAL_TABLET | Freq: Every day | ORAL | 0 refills | Status: DC

## 2018-02-20 MED ORDER — SODIUM CHLORIDE 0.9 % IV BOLUS
1000.0000 mL | Freq: Once | INTRAVENOUS | Status: AC
  Administered 2018-02-20: 1000 mL via INTRAVENOUS

## 2018-02-20 MED ORDER — AMOXICILLIN-POT CLAVULANATE 875-125 MG PO TABS: 1.0000 | ORAL_TABLET | Freq: Two times a day (BID) | ORAL | 0 refills | Status: DC

## 2018-02-20 NOTE — Discharge Instructions (Signed)
Your chest x-ray shows pneumonia.  Please take both antibiotics as directed.  You can also take ibuprofen and Tylenol to help with pain, Robitussin, Delsym or Mucinex to help with cough, Flonase or Zyrtec to help with nasal congestion.  Make sure you are drinking plenty of fluids.  Follow-up with your primary care doctor if symptoms are not improving and you should have a chest x-ray to ensure your pneumonia has resolved in about 3 to 4 weeks.  Return to the emergency department for persistent fevers, chest pain, shortness of breath severely worsened cough or any other new or concerning symptoms.

## 2018-02-20 NOTE — ED Triage Notes (Signed)
Patient is AOx4 and ambulatory. Patient arrived via POV. Patient chief complaint is body aches, fever, chills, lethargi, cough and not being able to eat. Patient stated that symptoms have been going on for 7 days and has not eaten in 7 days.

## 2018-02-20 NOTE — ED Notes (Signed)
Patient transported to X-ray 

## 2018-02-20 NOTE — ED Provider Notes (Signed)
[facility]-EMERGENCY DEPT Provider Note   CSN: [id number] Arrival date & time: 02/20/18  1551     History   Chief Complaint Chief Complaint  Patient presents with  . Flue Like Symptoms    HPI Lauren Collins is a 27 y.o. female.  Lauren Collins is a 27 y.o. Female who is otherwise healthy presents to the emergency department for evaluation of fevers, chills, body aches, lethargy and cough.  She reports symptoms onset about a week ago symptoms have been constant and not improving.  She reports her neighbors are primarily present early on in the illness and is improved.  She reports cough is intermittently productive.  She denies any chest pain or shortness of breath.  She reports some nausea and having no appetite although she is continued to drink plenty of water.  She denies any abdominal pain, no diarrhea.  She reports her to try and boys were diagnosed with URI last week and are now doing better but she is concerned she caught this from them.  She reports her symptoms just do not seem to be improving and she has no energy.  She has not taken anything to treat her symptoms at home is just tried to drink plenty of water and rest.     Past Medical History:  Diagnosis Date  . Anemia   . Gestational diabetes    glyburide  . Hx of chlamydia infection   . Hx of varicella   . Kidney stones     Patient Active Problem List   Diagnosis Date Noted  . Pregnancy 07/09/2015  . Threatened preterm labor 05/09/2015    Past Surgical History:  Procedure Laterality Date  . CESAREAN SECTION MULTI-GESTATIONAL  07/09/2015   Procedure: CESAREAN SECTION MULTI-GESTATIONAL;  Surgeon: Levi Aland, MD;  Location: [facility];  Service: Obstetrics;;  . NO PAST SURGERIES       OB History    Gravida  3   Para  1   Term  1   Preterm      AB  2   Living  2     SAB  1   TAB  1   Ectopic      Multiple  1   Live Births  2            Home Medications     Prior to Admission medications   Medication Sig Start Date End Date Taking? Authorizing Provider  amoxicillin-clavulanate (AUGMENTIN) 875-125 MG tablet Take 1 tablet by mouth 2 (two) times daily. One po bid x 7 days 02/20/18   Dartha Lodge, PA-C  azithromycin (ZITHROMAX) 250 MG tablet Take 1 tablet (250 mg total) by mouth daily. Take 1 tablet daily until finished 02/20/18   Dartha Lodge, PA-C  doxycycline (VIBRAMYCIN) 100 MG capsule Take 1 capsule (100 mg total) by mouth 2 (two) times daily. Patient not taking: Reported on 09/01/2016 01/10/16   Arthor Captain, PA-C  HYDROcodone-acetaminophen (NORCO/VICODIN) 5-325 MG tablet Take 1 tablet by mouth every 4 (four) hours as needed. 09/01/16   Tegeler, Canary Brim, MD  naproxen (NAPROSYN) 375 MG tablet Take 1 tablet (375 mg total) by mouth 2 (two) times daily. Patient not taking: Reported on 09/01/2016 01/10/16   Arthor Captain, PA-C  ondansetron (ZOFRAN) 4 MG tablet Take 1 tablet (4 mg total) by mouth every 8 (eight) hours as needed for nausea or vomiting. 09/01/16   Tegeler, Canary Brim, MD  oxyCODONE-acetaminophen (PERCOCET/ROXICET) 5-325 MG tablet Take  1 tablet by mouth every 4 (four) hours as needed for moderate pain. Patient not taking: Reported on 09/01/2016 07/11/15   Carrington Clamp, MD    Family History Family History  Problem Relation Age of Onset  . Diabetes Father   . Kidney disease Paternal Grandmother   . Alcohol abuse Neg Hx   . Arthritis Neg Hx   . Asthma Neg Hx   . Birth defects Neg Hx   . Cancer Neg Hx   . COPD Neg Hx   . Depression Neg Hx   . Drug abuse Neg Hx   . Early death Neg Hx   . Hearing loss Neg Hx   . Heart disease Neg Hx   . Hyperlipidemia Neg Hx   . Hypertension Neg Hx   . Learning disabilities Neg Hx   . Mental illness Neg Hx   . Mental retardation Neg Hx   . Miscarriages / Stillbirths Neg Hx   . Stroke Neg Hx   . Vision loss Neg Hx   . Varicose Veins Neg Hx     Social History Social History    Tobacco Use  . Smoking status: Never Smoker  . Smokeless tobacco: Never Used  Substance Use Topics  . Alcohol use: No  . Drug use: No     Allergies   Patient has no known allergies.   Review of Systems Review of Systems  Constitutional: Positive for appetite change, chills and fever.  HENT: Positive for congestion, rhinorrhea and sore throat. Negative for ear pain.   Eyes: Negative for visual disturbance.  Respiratory: Positive for cough. Negative for chest tightness, shortness of breath and wheezing.   Cardiovascular: Negative for chest pain.  Gastrointestinal: Negative for abdominal pain, nausea and vomiting.  Musculoskeletal: Positive for myalgias. Negative for arthralgias.  Skin: Negative for color change and rash.  Neurological: Negative for dizziness, syncope, light-headedness and headaches.     Physical Exam Updated Vital Signs BP 118/69 (BP Location: Left Arm)   Pulse (!) 107   Temp 99.2 F (37.3 C) (Oral)   Resp 19   Ht 5' (1.524 m)   Wt 86.2 kg   LMP 02/16/2018 (Exact Date)   SpO2 95%   BMI 37.11 kg/m   Physical Exam  Constitutional: She appears well-developed and well-nourished. She does not appear ill. No distress.  HENT:  Head: Normocephalic and atraumatic.  Mouth/Throat: Oropharynx is clear and moist.  TMs clear with good landmarks, moderate nasal mucosa edema with clear rhinorrhea, posterior oropharynx clear and moist, with some erythema, no edema or exudates, uvula midline  Eyes: Right eye exhibits no discharge. Left eye exhibits no discharge.  Neck: Neck supple.  No rigidity  Cardiovascular: Normal rate, regular rhythm, normal heart sounds and intact distal pulses.  Pulmonary/Chest: Effort normal and breath sounds normal. No respiratory distress.  Respirations equal and unlabored, patient able to speak in full sentences, lungs clear to auscultation bilaterally, occasional cough during exam  Abdominal: Soft. Bowel sounds are normal. She  exhibits no distension and no mass. There is no tenderness. There is no guarding.  Abdomen soft, nondistended, nontender to palpation in all quadrants without guarding or peritoneal signs  Musculoskeletal: She exhibits no deformity.  Lymphadenopathy:    She has no cervical adenopathy.  Neurological: She is alert.  Skin: Skin is warm and dry. Capillary refill takes less than 2 seconds. She is not diaphoretic.  Nursing note and vitals reviewed.    ED Treatments / Results  Labs (all labs  ordered are listed, but only abnormal results are displayed) Labs Reviewed  INFLUENZA PANEL BY PCR (TYPE A & B)    EKG None  Radiology Dg Chest 2 View  Result Date: 02/20/2018 CLINICAL DATA:  Body aches with fever, chills, lethargy and cough. Symptoms 1 week. EXAM: CHEST - 2 VIEW COMPARISON:  None. FINDINGS: Lungs are adequately inflated demonstrate airspace consolidation over the posterior left lower lobe compatible with a pneumonia. No definite effusion. Cardiomediastinal silhouette is within normal. Old right clavicle fracture. Small metallic fragments over the right clavicle fracture. Remainder of the exam is within normal. IMPRESSION: Airspace process over the posterior left lower lobe likely a pneumonia. Electronically Signed   By: Elberta Fortis M.D.   On: 02/20/2018 19:51    Procedures Procedures (including critical care time)  Medications Ordered in ED Medications  ketorolac (TORADOL) 30 MG/ML injection 30 mg (30 mg Intravenous Given 02/20/18 1936)  sodium chloride 0.9 % bolus 1,000 mL (0 mLs Intravenous Stopped 02/20/18 2151)  ondansetron (ZOFRAN) injection 4 mg (4 mg Intravenous Given 02/20/18 1935)  amoxicillin-clavulanate (AUGMENTIN) 875-125 MG per tablet 1 tablet (1 tablet Oral Given 02/20/18 2006)  azithromycin (ZITHROMAX) tablet 500 mg (500 mg Oral Given 02/20/18 2007)     Initial Impression / Assessment and Plan / ED Course  I have reviewed the triage vital signs and the nursing  notes.  Pertinent labs & imaging results that were available during my care of the patient were reviewed by me and considered in my medical decision making (see chart for details).  Patient has been diagnosed with CAP via chest xray. Flu swab negative. Pt is not ill appearing, immunocompromised, and does not have multiple co morbidities, therefore I feel like the they can be treated as an OP with abx therapy. Pt has been advised to return to the ED if symptoms worsen or they do not improve. Pt verbalizes understanding and is agreeable with plan.    Final Clinical Impressions(s) / ED Diagnoses   Final diagnoses:  Community acquired pneumonia of left lower lobe of lung [facility])    ED Discharge Orders         Ordered    amoxicillin-clavulanate (AUGMENTIN) 875-125 MG tablet  2 times daily     02/20/18 2053    azithromycin (ZITHROMAX) 250 MG tablet  Daily     02/20/18 2053           Dartha Lodge, [place] 02/21/18 2956    Arby Barrette, MD 02/21/18 2002

## 2018-02-20 NOTE — ED Notes (Signed)
Pt stated that both of her twin boys recently got diagnosed with an URI and not the flu

## 2020-03-25 ENCOUNTER — Other Ambulatory Visit: Payer: Self-pay

## 2020-03-25 ENCOUNTER — Encounter (HOSPITAL_COMMUNITY): Payer: Self-pay

## 2020-03-25 ENCOUNTER — Ambulatory Visit (HOSPITAL_COMMUNITY)
Admission: EM | Admit: 2020-03-25 | Discharge: 2020-03-25 | Disposition: A | Discharge status: Home / Self Care | Payer: Self-pay | Attending: Internal Medicine | Admitting: Internal Medicine

## 2020-03-25 DIAGNOSIS — N760 Acute vaginitis: Secondary | ICD-10-CM | POA: Insufficient documentation

## 2020-03-25 MED ORDER — METRONIDAZOLE 500 MG PO TABS: 500.0000 mg | ORAL_TABLET | Freq: Two times a day (BID) | ORAL | 0 refills | Status: DC

## 2020-03-25 NOTE — ED Triage Notes (Signed)
Pt in with c/o white, thick vaginal discharge and itching that has been going on for about 1 week.  Denies any burning during urination or other urinary sxs, or abdominal pain

## 2020-03-25 NOTE — Discharge Instructions (Addendum)
Please take medications as prescribed Avoid alcohol intake with the antibiotics prescribed I will encourage you to sign up for MyChart to follow-up with your lab results as well We will call you if your lab results are significant If your symptoms worsen please return to urgent care to be reevaluated.

## 2020-03-26 NOTE — ED Provider Notes (Signed)
[facility]    CSN: [id number] Arrival date & time: 03/25/20  4098      History   Chief Complaint Chief Complaint  Patient presents with  . Vaginal Discharge    HPI Lauren Collins is a 29 y.o. female comes to urgent care with complaints of thick whitish vaginal discharge with some malodorous smell.  Patient denies any dysuria urgency or frequency.  No lower abdominal pain.  Patient is sexually active with single partner that she has no concerns for pregnancy or sexually transmitted infections.  She is being diagnosed with bacterial vaginosis in the past and she says that the symptoms are similar.  No rash or ulcerations in the perineal area.Marland Kitchen   HPI  Past Medical History:  Diagnosis Date  . Anemia   . Gestational diabetes    glyburide  . Hx of chlamydia infection   . Hx of varicella   . Kidney stones     Patient Active Problem List   Diagnosis Date Noted  . Pregnancy 07/09/2015  . Threatened preterm labor 05/09/2015    Past Surgical History:  Procedure Laterality Date  . CESAREAN SECTION MULTI-GESTATIONAL  07/09/2015   Procedure: CESAREAN SECTION MULTI-GESTATIONAL;  Surgeon: Levi Aland, MD;  Location: [facility];  Service: Obstetrics;;  . NO PAST SURGERIES      OB History    Gravida  3   Para  1   Term  1   Preterm      AB  2   Living  2     SAB  1   TAB  1   Ectopic      Multiple  1   Live Births  2            Home Medications    Prior to Admission medications   Medication Sig Start Date End Date Taking? Authorizing Provider  HYDROcodone-acetaminophen (NORCO/VICODIN) 5-325 MG tablet Take 1 tablet by mouth every 4 (four) hours as needed. 09/01/16   Tegeler, Canary Brim, MD  metroNIDAZOLE (FLAGYL) 500 MG tablet Take 1 tablet (500 mg total) by mouth 2 (two) times daily. 03/25/20   Echo Propp, Britta Mccreedy, MD  ondansetron (ZOFRAN) 4 MG tablet Take 1 tablet (4 mg total) by mouth every 8 (eight) hours as needed for nausea or vomiting.  09/01/16   Tegeler, Canary Brim, MD    Family History Family History  Problem Relation Age of Onset  . Diabetes Father   . Kidney disease Paternal Grandmother   . Alcohol abuse Neg Hx   . Arthritis Neg Hx   . Asthma Neg Hx   . Birth defects Neg Hx   . Cancer Neg Hx   . COPD Neg Hx   . Depression Neg Hx   . Drug abuse Neg Hx   . Early death Neg Hx   . Hearing loss Neg Hx   . Heart disease Neg Hx   . Hyperlipidemia Neg Hx   . Hypertension Neg Hx   . Learning disabilities Neg Hx   . Mental illness Neg Hx   . Mental retardation Neg Hx   . Miscarriages / Stillbirths Neg Hx   . Stroke Neg Hx   . Vision loss Neg Hx   . Varicose Veins Neg Hx     Social History Social History   Tobacco Use  . Smoking status: Never Smoker  . Smokeless tobacco: Never Used  Vaping Use  . Vaping Use: Never assessed  Substance Use Topics  .  Alcohol use: No  . Drug use: No     Allergies   Patient has no known allergies.   Review of Systems Review of Systems  Constitutional: Negative.   Respiratory: Negative.   Gastrointestinal: Negative.   Genitourinary: Positive for vaginal discharge and vaginal pain. Negative for dysuria, hematuria, pelvic pain, urgency and vaginal bleeding.  Musculoskeletal: Negative.      Physical Exam Triage Vital Signs ED Triage Vitals  Enc Vitals Group     BP 03/25/20 0945 135/89     Pulse Rate 03/25/20 0945 83     Resp 03/25/20 0945 19     Temp 03/25/20 0945 98.5 F (36.9 C)     Temp Source 03/25/20 0945 Oral     SpO2 03/25/20 0945 98 %     Weight --      Height --      Head Circumference --      Peak Flow --      Pain Score 03/25/20 0943 0     Pain Loc --      Pain Edu? --      Excl. in GC? --    No data found.  Updated Vital Signs BP 135/89 (BP Location: Right Arm)   Pulse 83   Temp 98.5 F (36.9 C) (Oral)   Resp 19   LMP 03/08/2020 (Approximate)   SpO2 98%   Breastfeeding No   Visual Acuity Right Eye Distance:   Left Eye  Distance:   Bilateral Distance:    Right Eye Near:   Left Eye Near:    Bilateral Near:     Physical Exam Vitals and nursing note reviewed.  Constitutional:      General: She is not in acute distress.    Appearance: She is not ill-appearing.  Cardiovascular:     Rate and Rhythm: Normal rate and regular rhythm.     Pulses: Normal pulses.     Heart sounds: Normal heart sounds.  Pulmonary:     Effort: Pulmonary effort is normal. No respiratory distress.     Breath sounds: Normal breath sounds. No rhonchi.  Abdominal:     General: Bowel sounds are normal.     Palpations: Abdomen is soft.  Neurological:     Mental Status: She is alert.      UC Treatments / Results  Labs (all labs ordered are listed, but only abnormal results are displayed) Labs Reviewed  CERVICOVAGINAL ANCILLARY ONLY    EKG   Radiology No results found.  Procedures Procedures (including critical care time)  Medications Ordered in UC Medications - No data to display  Initial Impression / Assessment and Plan / UC Course  I have reviewed the triage vital signs and the nursing notes.  Pertinent labs & imaging results that were available during my care of the patient were reviewed by me and considered in my medical decision making (see chart for details).     1.  Acute vaginitis: Cervical vaginal swab for BV/vaginal yeast Flagyl 500 mg twice daily for 7 days Return precautions given If labs show any abnormality requiring changes to plan of care-we will reach out to the patient and make the necessary changes to her plan of care. Final Clinical Impressions(s) / UC Diagnoses   Final diagnoses:  Acute vaginitis     Discharge Instructions     Please take medications as prescribed Avoid alcohol intake with the antibiotics prescribed I will encourage you to sign up for MyChart to follow-up with your  lab results as well We will call you if your lab results are significant If your symptoms worsen  please return to urgent care to be reevaluated.   ED Prescriptions    Medication Sig Dispense Auth. Provider   metroNIDAZOLE (FLAGYL) 500 MG tablet Take 1 tablet (500 mg total) by mouth 2 (two) times daily. 14 tablet Jazma Pickel, Britta Mccreedy, MD     PDMP not reviewed this encounter.   Merrilee Jansky, MD 03/26/20 [phone]

## 2020-03-27 LAB — CERVICOVAGINAL ANCILLARY ONLY
Bacterial Vaginitis (gardnerella): POSITIVE — AB
Candida Glabrata: NEGATIVE
Candida Vaginitis: NEGATIVE
Comment: NEGATIVE
Comment: NEGATIVE
Comment: NEGATIVE

## 2021-07-20 ENCOUNTER — Emergency Department (HOSPITAL_COMMUNITY)
Admission: EM | Admit: 2021-07-20 | Discharge: 2021-07-20 | Disposition: A | Discharge status: Home / Self Care | Payer: Medicaid Other | Attending: Emergency Medicine | Admitting: Emergency Medicine

## 2021-07-20 ENCOUNTER — Encounter (HOSPITAL_COMMUNITY): Payer: Self-pay | Admitting: Emergency Medicine

## 2021-07-20 ENCOUNTER — Emergency Department (HOSPITAL_COMMUNITY): Payer: Medicaid Other

## 2021-07-20 DIAGNOSIS — R519 Headache, unspecified: Secondary | ICD-10-CM | POA: Diagnosis not present

## 2021-07-20 DIAGNOSIS — I159 Secondary hypertension, unspecified: Secondary | ICD-10-CM | POA: Diagnosis not present

## 2021-07-20 DIAGNOSIS — I10 Essential (primary) hypertension: Secondary | ICD-10-CM | POA: Diagnosis present

## 2021-07-20 LAB — BASIC METABOLIC PANEL
Anion gap: 9 (ref 5–15)
BUN: 11 mg/dL (ref 6–20)
CO2: 22 mmol/L (ref 22–32)
Calcium: 8.6 mg/dL — ABNORMAL LOW (ref 8.9–10.3)
Chloride: 105 mmol/L (ref 98–111)
Creatinine, Ser: 0.75 mg/dL (ref 0.44–1.00)
GFR, Estimated: >60 mL/min (ref >60)
Glucose, Bld: 89 mg/dL (ref 70–99)
Potassium: 3.5 mmol/L (ref 3.5–5.1)
Sodium: 136 mmol/L (ref 135–145)

## 2021-07-20 LAB — CBC WITH DIFFERENTIAL/PLATELET
Abs Immature Granulocytes: 0.01 10*3/uL (ref 0.00–0.07)
Basophils Absolute: 0 10*3/uL (ref 0.0–0.1)
Basophils Relative: 0 %
Eosinophils Absolute: 0.2 10*3/uL (ref 0.0–0.5)
Eosinophils Relative: 3 %
HCT: 37.3 % (ref 36.0–46.0)
Hemoglobin: 11.9 g/dL — ABNORMAL LOW (ref 12.0–15.0)
Immature Granulocytes: 0 %
Lymphocytes Relative: 37 %
Lymphs Abs: 2.1 10*3/uL (ref 0.7–4.0)
MCH: 25.3 pg — ABNORMAL LOW (ref 26.0–34.0)
MCHC: 31.9 g/dL (ref 30.0–36.0)
MCV: 79.2 fL — ABNORMAL LOW (ref 80.0–100.0)
Monocytes Absolute: 0.3 10*3/uL (ref 0.1–1.0)
Monocytes Relative: 6 %
Neutro Abs: 3 10*3/uL (ref 1.7–7.7)
Neutrophils Relative %: 54 %
Platelets: 345 10*3/uL (ref 150–400)
RBC: 4.71 MIL/uL (ref 3.87–5.11)
RDW: 14.1 % (ref 11.5–15.5)
WBC: 5.7 10*3/uL (ref 4.0–10.5)
nRBC: 0 % (ref 0.0–0.2)

## 2021-07-20 LAB — I-STAT BETA HCG BLOOD, ED (MC, WL, AP ONLY): I-stat hCG, quantitative: <5 m[IU]/mL (ref <5)

## 2021-07-20 NOTE — ED Triage Notes (Signed)
Patient c/o hypertension since seen at plastic surgeon x3 days ago. Reports not feeling well since that  time with headache and fatigue. BP 134/83 in triage.  ?

## 2021-07-20 NOTE — ED Provider Triage Note (Addendum)
Emergency Medicine Provider Triage Evaluation Note ? ?Lauren Collins , a 31 y.o. female  was evaluated in triage.  Pt complains of frontal headache, dizziness, fatigue for the last several days. She is concerned because she has had hypertension for the last several days. She also had some sob which she states is better since coming to the ED ? ?Denies uri sxs. ? ?Review of Systems  ?Positive: Headache, dizziness, fatigue ?Negative: Cough, congestion, chest pain ? ?Physical Exam  ?BP 134/83 (BP Location: Left Arm)   Pulse 82   Temp 98.9 ?F (37.2 ?C) (Oral)   Resp 17   SpO2 96%  ?Gen:   Awake, no distress   ?Resp:  Normal effort  ?MSK:   Moves extremities without difficulty  ?Other:  Mental Status:  ?Alert, thought content appropriate, able to give a coherent history. Speech fluent without evidence of aphasia. Able to follow 2 step commands without difficulty.  ?Cranial Nerves:  ?II:  pupils equal, round, reactive to light ?III,IV, VI: ptosis not present, extra-ocular motions intact bilaterally  ?V,VII: smile symmetric, facial light touch sensation equal ?VIII: hearing grossly normal to voice  ?X: uvula elevates symmetrically  ?XI: bilateral shoulder shrug symmetric and strong ?XII: midline tongue extension without fassiculations ?Motor:  ?Normal tone. 5/5 strength of BUE and BLE major muscle groups  ?Cerebellar: normal finger-to-nose with bilateral upper extremities, normal heel to shin ?Gait: normal gait and balance.  ? ? ?Medical Decision Making  ?Medically screening exam initiated at 1:57 PM.  Appropriate orders placed.  Mariaceleste Herrera was informed that the remainder of the evaluation will be completed by another provider, this initial triage assessment does not replace that evaluation, and the importance of remaining in the ED until their evaluation is complete. ? ? ?  ?Samson Frederic, Vasiliy Mccarry S, PA-C ?07/20/21 1350 ? ?  ?Karrie Meres, PA-C ?07/20/21 1358 ? ?

## 2021-07-20 NOTE — ED Provider Notes (Signed)
?[facility] DEPT ?Provider Note ? ? ?CSN: [id number] ?Arrival date & time: 07/20/21  1330 ? ?  ? ?History ? ?Chief Complaint  ?Patient presents with  ? Hypertension  ? ? ?Lauren Collins is a 31 y.o. female. ? ?The history is provided by the patient.  ?Hypertension ?This is a new problem. The problem occurs rarely. The problem has been resolved. Pertinent negatives include no chest pain, no abdominal pain, no headaches and no shortness of breath. Nothing aggravates the symptoms. Nothing relieves the symptoms. She has tried nothing for the symptoms. The treatment provided no relief.  ? ?  ? ?Home Medications ?Prior to Admission medications   ?Medication Sig Start Date End Date Taking? Authorizing Provider  ?HYDROcodone-acetaminophen (NORCO/VICODIN) 5-325 MG tablet Take 1 tablet by mouth every 4 (four) hours as needed. 09/01/16   Tegeler, Gwenyth Allegra, MD  ?metroNIDAZOLE (FLAGYL) 500 MG tablet Take 1 tablet (500 mg total) by mouth 2 (two) times daily. 03/25/20   Lamptey, Myrene Galas, MD  ?ondansetron (ZOFRAN) 4 MG tablet Take 1 tablet (4 mg total) by mouth every 8 (eight) hours as needed for nausea or vomiting. 09/01/16   Tegeler, Gwenyth Allegra, MD  ?   ? ?Allergies    ?Patient has no known allergies.   ? ?Review of Systems   ?Review of Systems  ?Respiratory:  Negative for shortness of breath.   ?Cardiovascular:  Negative for chest pain.  ?Gastrointestinal:  Negative for abdominal pain.  ?Neurological:  Negative for headaches.  ? ?Physical Exam ?Updated Vital Signs ?BP 134/83 (BP Location: Left Arm)   Pulse 82   Temp 98.9 ?F (37.2 ?C) (Oral)   Resp 17   SpO2 96%  ?Physical Exam ?Vitals and nursing note reviewed.  ?Constitutional:   ?   General: She is not in acute distress. ?   Appearance: She is well-developed.  ?HENT:  ?   Head: Normocephalic and atraumatic.  ?   Nose: Nose normal.  ?Eyes:  ?   Extraocular Movements: Extraocular movements intact.  ?   Conjunctiva/sclera: Conjunctivae normal.   ?   Pupils: Pupils are equal, round, and reactive to light.  ?Cardiovascular:  ?   Rate and Rhythm: Normal rate and regular rhythm.  ?   Pulses: Normal pulses.  ?   Heart sounds: Normal heart sounds. No murmur heard. ?Pulmonary:  ?   Effort: Pulmonary effort is normal. No respiratory distress.  ?   Breath sounds: Normal breath sounds.  ?Abdominal:  ?   Palpations: Abdomen is soft.  ?   Tenderness: There is no abdominal tenderness.  ?Musculoskeletal:     ?   General: No swelling.  ?   Cervical back: Neck supple.  ?Skin: ?   General: Skin is warm and dry.  ?   Capillary Refill: Capillary refill takes less than 2 seconds.  ?Neurological:  ?   General: No focal deficit present.  ?   Mental Status: She is alert and oriented to person, place, and time.  ?   Cranial Nerves: No cranial nerve deficit.  ?   Sensory: No sensory deficit.  ?   Motor: No weakness.  ?   Coordination: Coordination normal.  ?   Gait: Gait normal.  ?Psychiatric:     ?   Mood and Affect: Mood normal.  ? ? ?ED Results / Procedures / Treatments   ?Labs ?(all labs ordered are listed, but only abnormal results are displayed) ?Labs Reviewed  ?CBC WITH DIFFERENTIAL/PLATELET - Abnormal;  Notable for the following components:  ?    Result Value  ? Hemoglobin 11.9 (*)   ? MCV 79.2 (*)   ? MCH 25.3 (*)   ? All other components within normal limits  ?BASIC METABOLIC PANEL - Abnormal; Notable for the following components:  ? Calcium 8.6 (*)   ? All other components within normal limits  ?I-STAT BETA HCG BLOOD, ED ([facility], [facility], [facility] ONLY)  ? ? ?EKG ?EKG Interpretation ? ?Date/Time:  Sunday July 20 2021 14:01:33 EDT ?Ventricular Rate:  75 ?PR Interval:  118 ?QRS Duration: 77 ?QT Interval:  378 ?QTC Calculation: 423 ?R Axis:   53 ?Text Interpretation: Sinus rhythm Borderline short PR interval Confirmed by Lennice Sites [id number]) on 07/20/2021 2:52:20 PM ? ?Radiology ?CT Head Wo Contrast ? ?Result Date: 07/20/2021 ?CLINICAL DATA:  Headache, sudden, severe EXAM: CT HEAD WITHOUT  CONTRAST TECHNIQUE: Contiguous axial images were obtained from the base of the skull through the vertex without intravenous contrast. RADIATION DOSE REDUCTION: This exam was performed according to the departmental dose-optimization program which includes automated exposure control, adjustment of the mA and/or kV according to patient size and/or use of iterative reconstruction technique. COMPARISON:  None. FINDINGS: Brain: No evidence of acute infarction, hemorrhage, hydrocephalus, extra-axial collection or mass lesion/mass effect. Vascular: No hyperdense vessel or unexpected calcification. Skull: Normal. Negative for fracture or focal lesion. Sinuses/Orbits: Partial opacification of multiple posterior left ethmoid air cells. Paranasal sinuses and mastoid air cells are otherwise clear. Other: None. IMPRESSION: 1. No acute intracranial abnormality. 2. Partial opacification of multiple posterior left ethmoid air cells. Correlate for sinusitis. Electronically Signed   By: Davina Poke D.O.   On: 07/20/2021 14:52   ? ?Procedures ?Procedures  ? ? ?Medications Ordered in ED ?Medications - No data to display ? ?ED Course/ Medical Decision Making/ A&P ?  ?                        ?Medical Decision Making ? ?Lauren Collins is here with concern for high blood pressure.  No chest pain, headache at times.  Normal vitals.  No fever.  Blood pressure is reassuring.  Neurologic exam is normal.  EKG per my review and interpretation shows sinus rhythm overall suspect asymptomatic hypertension versus worried well visit.  CT scan of the head was obtained given intermittent headaches and showed no head bleed per my review.  Including CBC, BMP were collected.  Per my review interpretation there were no abnormalities no anemia or leukocytosis or electrolyte issues.  Overall shared decision was made to record blood pressure twice a day for the next several weeks and follow-up with primary care doctor to make decision about blood pressure  meds.  Patient with normal blood pressure here but does state some abnormal blood pressures at home discharged in good condition.  Understands return precaution. ? ?This chart was dictated using voice recognition software.  Despite best efforts to proofread,  errors can occur which can change the documentation meaning.  ? ? ? ? ? ? ? ?Final Clinical Impression(s) / ED Diagnoses ?Final diagnoses:  ?Secondary hypertension  ? ? ?Rx / DC Orders ?ED Discharge Orders   ? ? None  ? ?  ? ? ?  ?Lennice Sites, DO ?07/20/21 1518 ? ?

## 2021-07-20 NOTE — Discharge Instructions (Addendum)
Blood pressure twice a day.  Follow-up with primary care physician and them to make a decision on your blood pressure ?

## 2022-03-25 ENCOUNTER — Encounter (HOSPITAL_BASED_OUTPATIENT_CLINIC_OR_DEPARTMENT_OTHER): Payer: Self-pay

## 2022-03-25 ENCOUNTER — Other Ambulatory Visit: Payer: Self-pay

## 2022-03-25 ENCOUNTER — Emergency Department (HOSPITAL_BASED_OUTPATIENT_CLINIC_OR_DEPARTMENT_OTHER)
Admission: EM | Admit: 2022-03-25 | Discharge: 2022-03-25 | Disposition: A | Discharge status: Home / Self Care | Payer: Medicaid Other | Attending: Emergency Medicine | Admitting: Emergency Medicine

## 2022-03-25 DIAGNOSIS — H9203 Otalgia, bilateral: Secondary | ICD-10-CM | POA: Insufficient documentation

## 2022-03-25 DIAGNOSIS — J029 Acute pharyngitis, unspecified: Secondary | ICD-10-CM | POA: Diagnosis present

## 2022-03-25 DIAGNOSIS — R0981 Nasal congestion: Secondary | ICD-10-CM | POA: Insufficient documentation

## 2022-03-25 DIAGNOSIS — M791 Myalgia, unspecified site: Secondary | ICD-10-CM | POA: Diagnosis not present

## 2022-03-25 LAB — GROUP A STREP BY PCR: Group A Strep by PCR: NOT DETECTED

## 2022-03-25 MED ORDER — PENICILLIN G BENZATHINE 1200000 UNIT/2ML IM SUSY
1.2000 10*6.[IU] | PREFILLED_SYRINGE | Freq: Once | INTRAMUSCULAR | Status: AC
  Administered 2022-03-25: 1.2 10*6.[IU] via INTRAMUSCULAR
  Filled 2022-03-25: qty 2

## 2022-03-25 NOTE — ED Triage Notes (Signed)
Pt states has sore throat and ear pain. Denies fever but states having general pain all over.

## 2022-03-25 NOTE — Discharge Instructions (Signed)
Thank you for allowing me to be part of your care today.  You were evaluated in the ER for sore throat and associated symptoms.  Your group A strep test was negative, however your son's was positive.  In this situation we typically treat both family members as our tests are not perfect.  You received a penicillin shot while in the ED.  I have provided information about supportive care measures for pharyngitis.  I recommend following up with your primary care physician if your symptoms do not improve or if they worsen.  Return to the ER if you develop any new or worsening symptoms or have any new concerns.

## 2022-03-25 NOTE — ED Provider Notes (Addendum)
[facility] EMERGENCY DEPT Provider Note   CSN: [id number] Arrival date & time: 03/25/22  0746     History  Chief Complaint  Patient presents with   Sore Throat    Pain in ears and body aches.     Lauren Collins is a 31 y.o. female presents to the ER with complaint of body aches, bilateral earache, sore throat and congestion for the past 3-4 days.  She states her son has similar symptoms and RSV is going around his school.  She has been treating her symptoms with Tylenol/Ibuprofen.  Denies fever, chills, shortness of breath, chest pain, nausea, vomiting, diarrhea, trouble swallowing.       Home Medications Prior to Admission medications   Medication Sig Start Date End Date Taking? Authorizing Provider  HYDROcodone-acetaminophen (NORCO/VICODIN) 5-325 MG tablet Take 1 tablet by mouth every 4 (four) hours as needed. 09/01/16   Tegeler, Canary Brim, MD  metroNIDAZOLE (FLAGYL) 500 MG tablet Take 1 tablet (500 mg total) by mouth 2 (two) times daily. 03/25/20   Lamptey, Britta Mccreedy, MD  ondansetron (ZOFRAN) 4 MG tablet Take 1 tablet (4 mg total) by mouth every 8 (eight) hours as needed for nausea or vomiting. 09/01/16   Tegeler, Canary Brim, MD      Allergies    Patient has no known allergies.    Review of Systems   Review of Systems  Constitutional:  Negative for chills and fever.  HENT:  Positive for congestion, ear pain, rhinorrhea and sore throat. Negative for trouble swallowing.   Respiratory:  Negative for shortness of breath.   Cardiovascular:  Negative for chest pain.  Gastrointestinal:  Negative for diarrhea, nausea and vomiting.    Physical Exam Updated Vital Signs BP 124/66 (BP Location: Right Arm)   Pulse 78   Temp 98.4 F (36.9 C) (Oral)   Resp 16   Ht 5' (1.524 m)   Wt 86.2 kg   SpO2 97%   BMI 37.11 kg/m  Physical Exam  ED Results / Procedures / Treatments   Labs (all labs ordered are listed, but only abnormal results are displayed) Labs  Reviewed  GROUP A STREP BY PCR    EKG None  Radiology No results found.  Procedures Procedures    Medications Ordered in ED Medications  penicillin g benzathine (BICILLIN LA) 1200000 UNIT/2ML injection 1.2 Million Units (1.2 Million Units Intramuscular Given 03/25/22 0858)    ED Course/ Medical Decision Making/ A&P                           Medical Decision Making Risk Prescription drug management.   This patient presents to the ED with chief complaint(s) of sore throat, body aches, ear pain for the past 3-4 days.  She is presenting to ED with her son that has similar complaints and reports RSV has been going around his school.   The differential diagnosis includes COVID, RSV, influenza, strep pharyngitis, URI   The initial plan is to obtain strep swab; I do not feel that additional testing of COVID, RSV, or influenza is warranted at this time as it would not change my treatment plan  Initial Assessment:   Exam significant for posterior oropharynx erythema, no exudate.  No difficulty swallowing.  No cervical lymphadenopathy.  She reports otalgia, TMs are normal without erythema or effusion.  Lungs clear to auscultation bilaterally.  Heart rate and rhythm are normal.  Skin is warm and dry.  Independent ECG/labs  interpretation:  The following labs were independently interpreted:  Group A strep - not detected  Independent visualization and interpretation of imaging: Based on patient presentation and physical exam findings, I do not feel that imaging is warranted at this time and patient would not benefit   Treatment and Reassessment: Discussed treatment options with patient, her son tested positive for group A strep, stated we typically treat both family members if one is positive due to possibility of false negatives.  She opted for treatment and chose to receive the penicillin shot while in ED.  Disposition:   After consideration of the diagnostic results and the  patients response to treatment, I feel that emergency department workup does not suggest an emergent condition requiring admission or immediate intervention beyond what has been performed at this time.  The patient is safe for discharge and has been instructed to return immediately for worsening symptoms, change in symptoms or any other concerns.  Discussed supportive care measures at home for symptoms.  Recommended follow-up with primary care physician if symptoms do not improve.    Discussed HPI, physical exam findings, assessment and plan with attending Melene Plan who agrees with current plan.          Final Clinical Impression(s) / ED Diagnoses Final diagnoses:  Pharyngitis, unspecified etiology    Rx / DC Orders ED Discharge Orders     None         Lenard Simmer, PA 03/25/22 0900    Melton Alar R, PA 03/25/22 0901    Melene Plan, DO 03/25/22 [phone]

## 2022-04-19 ENCOUNTER — Emergency Department (HOSPITAL_BASED_OUTPATIENT_CLINIC_OR_DEPARTMENT_OTHER)
Admission: EM | Admit: 2022-04-19 | Discharge: 2022-04-19 | Disposition: A | Discharge status: Home / Self Care | Payer: Medicaid Other | Attending: Emergency Medicine | Admitting: Emergency Medicine

## 2022-04-19 DIAGNOSIS — K029 Dental caries, unspecified: Secondary | ICD-10-CM | POA: Diagnosis not present

## 2022-04-19 DIAGNOSIS — K0889 Other specified disorders of teeth and supporting structures: Secondary | ICD-10-CM | POA: Diagnosis not present

## 2022-04-19 DIAGNOSIS — R519 Headache, unspecified: Secondary | ICD-10-CM | POA: Insufficient documentation

## 2022-04-19 MED ORDER — IBUPROFEN 800 MG PO TABS
800.0000 mg | ORAL_TABLET | Freq: Once | ORAL | Status: AC
  Administered 2022-04-19: 800 mg via ORAL
  Filled 2022-04-19: qty 1

## 2022-04-19 MED ORDER — ACETAMINOPHEN 325 MG PO TABS
650.0000 mg | ORAL_TABLET | Freq: Once | ORAL | Status: AC
  Administered 2022-04-19: 650 mg via ORAL
  Filled 2022-04-19: qty 2

## 2022-04-19 NOTE — ED Triage Notes (Signed)
Pt c/o left side upper jaw pain, radiating to left side of face and down backleft side of neck.  Pt denies and CP or radiation down arm, endorses some intermittent dizziness.  Pt took excedrin yesterday with some relief.

## 2022-04-19 NOTE — ED Notes (Signed)
Discharge paperwork given and verbally understood. 

## 2022-04-19 NOTE — ED Provider Notes (Signed)
[facility] EMERGENCY DEPT Provider Note   CSN: [id number] Arrival date & time: 04/19/22  6384     History  Chief Complaint  Patient presents with   Dental Pain    Juaquina Machnik is a 31 y.o. female.  Who presents to the ED for evaluation of left-sided dental pain.  Has not seen a dentist since she had her braces taken off 1 year ago.  Symptoms have been present for 24 hours.  Took Excedrin yesterday with moderate relief.  Pain is causing her to have a headache.  Denies fevers, chills, sore throat, cough, congestion, rhinorrhea, trismus, difficulty eating or drinking.  Has not taken anything for the pain today.  States she had strep throat 2 weeks ago.   Dental Pain      Home Medications Prior to Admission medications   Medication Sig Start Date End Date Taking? Authorizing Provider  HYDROcodone-acetaminophen (NORCO/VICODIN) 5-325 MG tablet Take 1 tablet by mouth every 4 (four) hours as needed. 09/01/16   Tegeler, Canary Brim, MD  metroNIDAZOLE (FLAGYL) 500 MG tablet Take 1 tablet (500 mg total) by mouth 2 (two) times daily. 03/25/20   Lamptey, Britta Mccreedy, MD  ondansetron (ZOFRAN) 4 MG tablet Take 1 tablet (4 mg total) by mouth every 8 (eight) hours as needed for nausea or vomiting. 09/01/16   Tegeler, Canary Brim, MD      Allergies    Patient has no known allergies.    Review of Systems   Review of Systems  HENT:  Positive for dental problem.   All other systems reviewed and are negative.   Physical Exam Updated Vital Signs BP 134/84 (BP Location: Right Arm)   Pulse (!) 58   Temp 98.2 F (36.8 C) (Oral)   Resp 18   SpO2 100%  Physical Exam Vitals and nursing note reviewed.  Constitutional:      General: She is not in acute distress.    Appearance: Normal appearance. She is normal weight. She is not ill-appearing.  HENT:     Head: Normocephalic and atraumatic.     Nose: No congestion or rhinorrhea.     Mouth/Throat:     Mouth: Mucous membranes are  moist.     Dentition: Dental caries present. No gingival swelling or dental abscesses.     Pharynx: Oropharynx is clear. No oropharyngeal exudate or posterior oropharyngeal erythema.     Tonsils: No tonsillar exudate or tonsillar abscesses.      Comments: No intraoral erythema or Swelling.  No signs of abscess.  Large cavity to the left lower molar.  No dental fractures Pulmonary:     Effort: Pulmonary effort is normal. No respiratory distress.  Abdominal:     General: Abdomen is flat.  Musculoskeletal:        General: Normal range of motion.     Cervical back: Normal range of motion and neck supple. No rigidity or tenderness.  Skin:    General: Skin is warm and dry.  Neurological:     General: No focal deficit present.     Mental Status: She is alert and oriented to person, place, and time.  Psychiatric:        Mood and Affect: Mood normal.        Behavior: Behavior normal.     ED Results / Procedures / Treatments   Labs (all labs ordered are listed, but only abnormal results are displayed) Labs Reviewed - No data to display  EKG EKG Interpretation  Date/Time:  Sunday April 19 2022 09:15:35 EST Ventricular Rate:  80 PR Interval:  128 QRS Duration: 84 QT Interval:  362 QTC Calculation: 417 R Axis:   65 Text Interpretation: Normal sinus rhythm Normal ECG When compared with ECG of 20-Jul-2021 14:01, No significant change since last tracing Confirmed by Meridee Score [phone]) on 04/19/2022 9:25:03 AM  Radiology No results found.  Procedures Procedures    Medications Ordered in ED Medications  acetaminophen (TYLENOL) tablet 650 mg (650 mg Oral Given 04/19/22 1213)  ibuprofen (ADVIL) tablet 800 mg (800 mg Oral Given 04/19/22 1213)    ED Course/ Medical Decision Making/ A&P                           Medical Decision Making Risk OTC drugs. Prescription drug management.  This patient presents to the ED for concern of dental pain, this involves an extensive  number of treatment options, and is a complaint that carries with it a high risk of complications and morbidity.  The differential diagnosis includes periapical or other intraoral abscess, trigeminal neuralgia, tooth ache   My initial workup includes Tylenol, ibuprofen  Additional history obtained from: Nursing notes from this visit.   Afebrile, hemodynamically stable.  31 year old female presents to the ED for evaluation of left-sided dental pain.  Physical exam reveals what appears to be a new cavity in the left lower molar, Was otherwise unremarkable.  Has not taken anything for the pain today.  Will be given Tylenol and ibuprofen in the ED and educated on appropriate dosing of Tylenol and ibuprofen at home.  She has not seen a dentist in the past year.  Strongly encouraged her to call to schedule follow-up appointment.  I have low suspicion for infectious etiology as cause of her pain.  Gave return precautions.  Stable at discharge.  At this time there does not appear to be any evidence of an acute emergency medical condition and the patient appears stable for discharge with appropriate outpatient follow up. Diagnosis was discussed with patient who verbalizes understanding of care plan and is agreeable to discharge. I have discussed return precautions with patient who verbalizes understanding. Patient encouraged to follow-up with their PCP within 1 week. All questions answered.  Note: Portions of this report may have been transcribed using voice recognition software. Every effort was made to ensure accuracy; however, inadvertent computerized transcription errors may still be present.         Final Clinical Impression(s) / ED Diagnoses Final diagnoses:  Pain, dental  Dental caries    Rx / DC Orders ED Discharge Orders     None         Mora Bellman 04/19/22 1216    Terrilee Files, MD 04/19/22 1850

## 2022-04-19 NOTE — Discharge Instructions (Addendum)
You have been seen today for your complaint of dental pain. Your discharge medications include Alternate tylenol and ibuprofen for pain. You may alternate these every 4 hours. You may take up to 800 mg of ibuprofen at a time and up to 1000 mg of tylenol. . Home care instructions are as follows:  Drink plenty of water.  Follow up with: a dentist as soon as possible Please seek immediate medical care if you develop any of the following symptoms: You are unable to open your mouth. You are having trouble breathing or swallowing. You have a fever. You notice that your face, neck, or jaw is swollen. At this time there does not appear to be the presence of an emergent medical condition, however there is always the potential for conditions to change. Please read and follow the below instructions.  Do not take your medicine if  develop an itchy rash, swelling in your mouth or lips, or difficulty breathing; call 911 and seek immediate emergency medical attention if this occurs.  You may review your lab tests and imaging results in their entirety on your MyChart account.  Please discuss all results of fully with your primary care provider and other specialist at your follow-up visit.  Note: Portions of this text may have been transcribed using voice recognition software. Every effort was made to ensure accuracy; however, inadvertent computerized transcription errors may still be present.

## 2022-05-21 ENCOUNTER — Other Ambulatory Visit: Payer: Self-pay

## 2022-05-21 ENCOUNTER — Encounter (HOSPITAL_BASED_OUTPATIENT_CLINIC_OR_DEPARTMENT_OTHER): Payer: Self-pay

## 2022-05-21 ENCOUNTER — Emergency Department (HOSPITAL_BASED_OUTPATIENT_CLINIC_OR_DEPARTMENT_OTHER)
Admission: EM | Admit: 2022-05-21 | Discharge: 2022-05-21 | Disposition: A | Discharge status: Home / Self Care | Payer: Medicaid Other | Attending: Emergency Medicine | Admitting: Emergency Medicine

## 2022-05-21 DIAGNOSIS — R079 Chest pain, unspecified: Secondary | ICD-10-CM | POA: Diagnosis present

## 2022-05-21 DIAGNOSIS — R0789 Other chest pain: Secondary | ICD-10-CM | POA: Insufficient documentation

## 2022-05-21 LAB — CBC WITH DIFFERENTIAL/PLATELET
Abs Immature Granulocytes: 0.01 10*3/uL (ref 0.00–0.07)
Basophils Absolute: 0 10*3/uL (ref 0.0–0.1)
Basophils Relative: 0 %
Eosinophils Absolute: 0.2 10*3/uL (ref 0.0–0.5)
Eosinophils Relative: 4 %
HCT: 37.9 % (ref 36.0–46.0)
Hemoglobin: 12.2 g/dL (ref 12.0–15.0)
Immature Granulocytes: 0 %
Lymphocytes Relative: 42 %
Lymphs Abs: 2.6 10*3/uL (ref 0.7–4.0)
MCH: 25.4 pg — ABNORMAL LOW (ref 26.0–34.0)
MCHC: 32.2 g/dL (ref 30.0–36.0)
MCV: 79 fL — ABNORMAL LOW (ref 80.0–100.0)
Monocytes Absolute: 0.3 10*3/uL (ref 0.1–1.0)
Monocytes Relative: 5 %
Neutro Abs: 3 10*3/uL (ref 1.7–7.7)
Neutrophils Relative %: 49 %
Platelets: 378 10*3/uL (ref 150–400)
RBC: 4.8 MIL/uL (ref 3.87–5.11)
RDW: 13.6 % (ref 11.5–15.5)
WBC: 6.1 10*3/uL (ref 4.0–10.5)
nRBC: 0 % (ref 0.0–0.2)

## 2022-05-21 LAB — COMPREHENSIVE METABOLIC PANEL
ALT: 14 U/L (ref 0–44)
AST: 12 U/L — ABNORMAL LOW (ref 15–41)
Albumin: 4.1 g/dL (ref 3.5–5.0)
Alkaline Phosphatase: 68 U/L (ref 38–126)
Anion gap: 9 (ref 5–15)
BUN: 15 mg/dL (ref 6–20)
CO2: 25 mmol/L (ref 22–32)
Calcium: 9.2 mg/dL (ref 8.9–10.3)
Chloride: 104 mmol/L (ref 98–111)
Creatinine, Ser: 0.79 mg/dL (ref 0.44–1.00)
GFR, Estimated: >60 mL/min (ref >60)
Glucose, Bld: 120 mg/dL — ABNORMAL HIGH (ref 70–99)
Potassium: 3.6 mmol/L (ref 3.5–5.1)
Sodium: 138 mmol/L (ref 135–145)
Total Bilirubin: 0.3 mg/dL (ref 0.3–1.2)
Total Protein: 7.3 g/dL (ref 6.5–8.1)

## 2022-05-21 LAB — TROPONIN I (HIGH SENSITIVITY)
Troponin I (High Sensitivity): <2 ng/L (ref <18)
Troponin I (High Sensitivity): <2 ng/L (ref <18)

## 2022-05-21 NOTE — ED Notes (Signed)
Pt ambulatory from waiting room at this time escorted by staff nurse- no acute distress noted. Will initiate cardiac monitoring and repeat troponin.

## 2022-05-21 NOTE — ED Provider Notes (Signed)
[facility] Provider Note   CSN: [id number] Arrival date & time: 05/21/22  0049     History  Chief Complaint  Patient presents with   Chest Pain    Lauren Collins is a 32 y.o. female.  Patient is a 32 year old female presenting with complaints of chest pain.  Patient has no significant past medical history and no prior cardiac history.  He presents with 2 days of discomfort to the front of her chest.  She describes this as an aching.  There is no associated nausea, shortness of breath, diaphoresis, or radiation.  She denies any fevers, chills, or cough.  She denies any recent exertional symptoms.  Patient has no prior cardiac history and no cardiac risk factors.  She does describe increased stress in her life.  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  She has been distressed over this.  The history is provided by the patient.       Home Medications Prior to Admission medications   Medication Sig Start Date End Date Taking? Authorizing Provider  HYDROcodone-acetaminophen (NORCO/VICODIN) 5-325 MG tablet Take 1 tablet by mouth every 4 (four) hours as needed. 09/01/16   Tegeler, Gwenyth Allegra, MD  metroNIDAZOLE (FLAGYL) 500 MG tablet Take 1 tablet (500 mg total) by mouth 2 (two) times daily. 03/25/20   Lamptey, Myrene Galas, MD  ondansetron (ZOFRAN) 4 MG tablet Take 1 tablet (4 mg total) by mouth every 8 (eight) hours as needed for nausea or vomiting. 09/01/16   Tegeler, Gwenyth Allegra, MD      Allergies    Patient has no known allergies.    Review of Systems   Review of Systems  All other systems reviewed and are negative.   Physical Exam Updated Vital Signs BP 130/85   Pulse 79   Temp 98.5 F (36.9 C)   Resp 20   Ht 5' (1.524 m)   Wt 96.7 kg   SpO2 100%   BMI 41.66 kg/m  Physical Exam Vitals and nursing note reviewed.  Constitutional:       General: She is not in acute distress.    Appearance: She is well-developed. She is not diaphoretic.  HENT:     Head: Normocephalic and atraumatic.  Cardiovascular:     Rate and Rhythm: Normal rate and regular rhythm.     Heart sounds: No murmur heard.    No friction rub. No gallop.  Pulmonary:     Effort: Pulmonary effort is normal. No respiratory distress.     Breath sounds: Normal breath sounds. No wheezing.  Abdominal:     General: Bowel sounds are normal. There is no distension.     Palpations: Abdomen is soft.     Tenderness: There is no abdominal tenderness.  Musculoskeletal:        General: Normal range of motion.     Cervical back: Normal range of motion and neck supple.     Right lower leg: No tenderness. No edema.     Left lower leg: No tenderness. No edema.  Skin:    General: Skin is warm and dry.  Neurological:     General: No focal deficit present.     Mental Status: She is alert and oriented to person, place, and time.     ED Results / Procedures / Treatments   Labs (all labs ordered are listed,  but only abnormal results are displayed) Labs Reviewed  CBC WITH DIFFERENTIAL/PLATELET - Abnormal; Notable for the following components:      Result Value   MCV 79.0 (*)    MCH 25.4 (*)    All other components within normal limits  COMPREHENSIVE METABOLIC PANEL - Abnormal; Notable for the following components:   Glucose, Bld 120 (*)    AST 12 (*)    All other components within normal limits  TROPONIN I (HIGH SENSITIVITY)  TROPONIN I (HIGH SENSITIVITY)    EKG EKG Interpretation  Date/Time:  Thursday May 21 2022 01:16:01 EST Ventricular Rate:  87 PR Interval:  124 QRS Duration: 70 QT Interval:  356 QTC Calculation: 428 R Axis:   57 Text Interpretation: Normal sinus rhythm Nonspecific T wave abnormality Abnormal ECG When compared with ECG of 19-Apr-2022 09:15, No significant change was found Confirmed by Veryl Speak [phone]) on 05/21/2022 1:23:01  AM  Radiology No results found.  Procedures Procedures    Medications Ordered in ED Medications - No data to display  ED Course/ Medical Decision Making/ A&P  Patient presenting today with complaints of chest pain as described in the HPI.  She arrives here with stable vital signs and physical examination which is basically unremarkable.  Workup initiated in triage including CBC, metabolic panel, and troponin.  These were all negative.  EKG obtained showing a sinus rhythm with no acute changes and is basically unchanged from prior study.  Patient presenting today with complaints of chest pain as described in the HPI.  Symptoms are most likely stress/anxiety related.  Nothing in the workup indicates a cardiac etiology and I have a low suspicion for other emergent process.  I feels the patient can safely be discharged with rest, ibuprofen, and follow-up as needed.  Final Clinical Impression(s) / ED Diagnoses Final diagnoses:  None    Rx / DC Orders ED Discharge Orders     None         Veryl Speak, MD 05/21/22 [phone]

## 2022-05-21 NOTE — ED Notes (Signed)
Pt agreeable with d/c plan as discussed by provider- this nurse has verbally reinforced d/c instructions and provided pt with written copy.  Pt acknowledges verbal understanding and denies any addl questions, concerns, needs- ambulatory independently at d/c with steady gait; vitals stable; no acute distress/changes noted.

## 2022-05-21 NOTE — ED Triage Notes (Signed)
Pt reports tightness in chest started earlier today +nausea and headache No other symptoms

## 2022-05-21 NOTE — Discharge Instructions (Signed)
Take ibuprofen 600 mg every 6 hours as needed for pain.  Rest.  Follow-up with primary doctor if symptoms are not improving in the next week, and return to the ER if you develop worsening pain, difficulty breathing, or for other new and concerning symptoms.

## 2023-03-08 ENCOUNTER — Other Ambulatory Visit: Payer: Self-pay

## 2023-03-08 ENCOUNTER — Emergency Department (HOSPITAL_BASED_OUTPATIENT_CLINIC_OR_DEPARTMENT_OTHER)
Admission: EM | Admit: 2023-03-08 | Discharge: 2023-03-08 | Disposition: A | Discharge status: Home / Self Care | Payer: Medicaid Other | Attending: Emergency Medicine | Admitting: Emergency Medicine

## 2023-03-08 ENCOUNTER — Encounter (HOSPITAL_BASED_OUTPATIENT_CLINIC_OR_DEPARTMENT_OTHER): Payer: Self-pay

## 2023-03-08 DIAGNOSIS — M542 Cervicalgia: Secondary | ICD-10-CM | POA: Diagnosis present

## 2023-03-08 DIAGNOSIS — M62838 Other muscle spasm: Secondary | ICD-10-CM

## 2023-03-08 MED ORDER — KETOROLAC TROMETHAMINE 30 MG/ML IJ SOLN
30.0000 mg | Freq: Once | INTRAMUSCULAR | Status: AC
  Administered 2023-03-08: 30 mg via INTRAMUSCULAR
  Filled 2023-03-08: qty 1

## 2023-03-08 MED ORDER — LIDOCAINE 5 % EX PTCH: 1.0000 | MEDICATED_PATCH | CUTANEOUS | 0 refills | Status: DC

## 2023-03-08 MED ORDER — LIDOCAINE 5 % EX PTCH
1.0000 | MEDICATED_PATCH | Freq: Once | CUTANEOUS | Status: DC
  Administered 2023-03-08: 1 via TRANSDERMAL
  Filled 2023-03-08: qty 1

## 2023-03-08 MED ORDER — CYCLOBENZAPRINE HCL 10 MG PO TABS
10.0000 mg | ORAL_TABLET | Freq: Once | ORAL | Status: AC
  Administered 2023-03-08: 10 mg via ORAL
  Filled 2023-03-08: qty 1

## 2023-03-08 MED ORDER — CYCLOBENZAPRINE HCL 10 MG PO TABS: 10.0000 mg | ORAL_TABLET | Freq: Two times a day (BID) | ORAL | 0 refills | Status: DC | PRN

## 2023-03-08 MED ORDER — ACETAMINOPHEN 500 MG PO TABS
1000.0000 mg | ORAL_TABLET | Freq: Once | ORAL | Status: AC
  Administered 2023-03-08: 1000 mg via ORAL
  Filled 2023-03-08: qty 2

## 2023-03-08 NOTE — ED Triage Notes (Signed)
Pt arrives ambulatory to ED with c/o stiff neck and upper back pain X2 days, denies any injury. Pt has been using topical cream on her neck and taking OTC ibuprofen as well with no relief in  pain.

## 2023-03-08 NOTE — Discharge Instructions (Signed)
You were seen in the emergency department for your neck pain.  You had no signs of broken bones or injury to your spinal cord and likely have a pulled muscle or muscle spasm in your neck.  You can continue to take Tylenol and Motrin every 6 hours as needed for pain.  I have given you a muscle relaxer that you can take as needed.  This can make you drowsy so do not take before driving, working or operating heavy machinery.  You can also use lidocaine patches, ice or heat.  You can follow-up with your primary doctor in the next few days to have your symptoms rechecked.  You should return to the emergency department if you develop weakness in your arms, fevers, severe chest pain or any other new or concerning symptoms.

## 2023-03-08 NOTE — ED Provider Notes (Signed)
[place] EMERGENCY DEPARTMENT AT [facility] Provider Note   CSN: [id number] Arrival date & time: 03/08/23  1056     History  Chief Complaint  Patient presents with   Torticollis    Lauren Collins is a 32 y.o. female.  Patient is a 32 year old female with no significant past medical history presenting to the emergency department with neck pain.  Patient states Saturday evening she started to develop left-sided neck pain and has had increasing pain in stiffness since then.  She states that one of her bra straps on the right side broke so she is unsure if it was putting more strain on the left side of her neck.  She states also when she woke up this morning she felt a swelling to the right side of her neck.  She denies any fevers, numbness or weakness, trauma or falls.  She states that she has been taking ibuprofen for pain at home with her last dose last night.  The history is provided by the patient.       Home Medications Prior to Admission medications   Medication Sig Start Date End Date Taking? Authorizing Provider  cyclobenzaprine (FLEXERIL) 10 MG tablet Take 1 tablet (10 mg total) by mouth 2 (two) times daily as needed for muscle spasms. 03/08/23  Yes Kingsley, Turkey K, DO  lidocaine (LIDODERM) 5 % Place 1 patch onto the skin daily. Remove & Discard patch within 12 hours or as directed by MD 03/08/23  Yes Theresia Lo, Cecile Sheerer, DO  HYDROcodone-acetaminophen (NORCO/VICODIN) 5-325 MG tablet Take 1 tablet by mouth every 4 (four) hours as needed. 09/01/16   Tegeler, Canary Brim, MD  metroNIDAZOLE (FLAGYL) 500 MG tablet Take 1 tablet (500 mg total) by mouth 2 (two) times daily. 03/25/20   Lamptey, Britta Mccreedy, MD  ondansetron (ZOFRAN) 4 MG tablet Take 1 tablet (4 mg total) by mouth every 8 (eight) hours as needed for nausea or vomiting. 09/01/16   Tegeler, Canary Brim, MD      Allergies    Patient has no known allergies.    Review of Systems   Review of  Systems  Physical Exam Updated Vital Signs BP (!) 175/92 (BP Location: Right Arm)   Pulse 72   Temp (!) 97.3 F (36.3 C) (Temporal)   Resp 17   Ht 5' (1.524 m)   Wt 99.8 kg   SpO2 98%   BMI 42.97 kg/m  Physical Exam Vitals and nursing note reviewed.  Constitutional:      General: She is not in acute distress.    Appearance: Normal appearance. She is obese.  HENT:     Head: Normocephalic and atraumatic.     Nose: Nose normal.     Mouth/Throat:     Mouth: Mucous membranes are moist.  Eyes:     Extraocular Movements: Extraocular movements intact.     Conjunctiva/sclera: Conjunctivae normal.  Cardiovascular:     Rate and Rhythm: Normal rate and regular rhythm.     Heart sounds: Normal heart sounds.  Pulmonary:     Effort: Pulmonary effort is normal.  Abdominal:     General: Abdomen is flat.  Musculoskeletal:        General: Normal range of motion.     Cervical back: Normal range of motion and neck supple. Tenderness (Left cervical paraspinal and left trapezius muscle tenderness, no midline neck tenderness) present. No rigidity.  Lymphadenopathy:     Cervical: Cervical adenopathy (Right sided) present.  Skin:  General: Skin is warm and dry.  Neurological:     General: No focal deficit present.     Mental Status: She is alert and oriented to person, place, and time.  Psychiatric:        Mood and Affect: Mood normal.        Behavior: Behavior normal.     ED Results / Procedures / Treatments   Labs (all labs ordered are listed, but only abnormal results are displayed) Labs Reviewed - No data to display  EKG None  Radiology No results found.  Procedures Procedures    Medications Ordered in ED Medications  lidocaine (LIDODERM) 5 % 1-3 patch (1 patch Transdermal Patch Applied 03/08/23 1202)  acetaminophen (TYLENOL) tablet 1,000 mg (1,000 mg Oral Given 03/08/23 1200)  ketorolac (TORADOL) 30 MG/ML injection 30 mg (30 mg Intramuscular Given 03/08/23 1200)   cyclobenzaprine (FLEXERIL) tablet 10 mg (10 mg Oral Given 03/08/23 1200)    ED Course/ Medical Decision Making/ A&P Clinical Course as of 03/08/23 1248  Mon Mar 08, 2023  1246 Upon reassessment, pain has improved. Patient is stable for discharge home with outpatient follow up. [VK]    Clinical Course User Index [VK] Rexford Maus, DO                                 Medical Decision Making This patient presents to the ED with chief complaint(s) of neck pain/stiffness with no pertinent past medical history which further complicates the presenting complaint. The complaint involves an extensive differential diagnosis and also carries with it a high risk of complications and morbidity.    The differential diagnosis includes muscle strain or spasm, no trauma or midline neck tenderness making fracture dislocation unlikely, no neurologic deficits making spinal cord injury or radiculopathy less likely  Additional history obtained: Additional history obtained from N/A Records reviewed Primary Care Documents  ED Course and Reassessment: On patient's arrival to the emergency department she was mildly hypertensive otherwise hemodynamically stable in no acute distress.  Patient's exam appears most consistent with muscle strain or spasm, has no midline neck tenderness and no neurologic deficits.  Patient will be treated with Tylenol, Toradol, Flexeril and lidocaine patch and will be closely reassessed.  Independent labs interpretation:  The following labs were independently interpreted: N/A  Independent visualization of imaging: - N/A  Consultation: - Consulted or discussed management/test interpretation w/ external professional: N/A  Consideration for admission or further workup: Patient has no emergent conditions requiring admission or further work-up at this time and is stable for discharge home with primary care follow-up  Social Determinants of health: N/A    Risk OTC  drugs. Prescription drug management.          Final Clinical Impression(s) / ED Diagnoses Final diagnoses:  Cervical paraspinal muscle spasm    Rx / DC Orders ED Discharge Orders          Ordered    cyclobenzaprine (FLEXERIL) 10 MG tablet  2 times daily PRN        03/08/23 1247    lidocaine (LIDODERM) 5 %  Every 24 hours        03/08/23 1247              Rexford Maus, DO 03/08/23 1249

## 2023-03-14 ENCOUNTER — Encounter (HOSPITAL_BASED_OUTPATIENT_CLINIC_OR_DEPARTMENT_OTHER): Payer: Self-pay

## 2023-03-14 ENCOUNTER — Other Ambulatory Visit: Payer: Self-pay

## 2023-03-14 ENCOUNTER — Emergency Department (HOSPITAL_BASED_OUTPATIENT_CLINIC_OR_DEPARTMENT_OTHER): Payer: Medicaid Other | Admitting: Radiology

## 2023-03-14 ENCOUNTER — Emergency Department (HOSPITAL_BASED_OUTPATIENT_CLINIC_OR_DEPARTMENT_OTHER): Payer: Medicaid Other

## 2023-03-14 ENCOUNTER — Emergency Department (HOSPITAL_BASED_OUTPATIENT_CLINIC_OR_DEPARTMENT_OTHER)
Admission: EM | Admit: 2023-03-14 | Discharge: 2023-03-14 | Disposition: A | Discharge status: Home / Self Care | Payer: Medicaid Other | Attending: Emergency Medicine | Admitting: Emergency Medicine

## 2023-03-14 DIAGNOSIS — M25512 Pain in left shoulder: Secondary | ICD-10-CM | POA: Insufficient documentation

## 2023-03-14 DIAGNOSIS — M25519 Pain in unspecified shoulder: Secondary | ICD-10-CM

## 2023-03-14 DIAGNOSIS — M542 Cervicalgia: Secondary | ICD-10-CM | POA: Insufficient documentation

## 2023-03-14 MED ORDER — KETOROLAC TROMETHAMINE 60 MG/2ML IM SOLN
60.0000 mg | Freq: Once | INTRAMUSCULAR | Status: AC
  Administered 2023-03-14: 60 mg via INTRAMUSCULAR
  Filled 2023-03-14: qty 2

## 2023-03-14 MED ORDER — KETOROLAC TROMETHAMINE 10 MG PO TABS: 10.0000 mg | ORAL_TABLET | Freq: Three times a day (TID) | ORAL | 0 refills | Status: AC | PRN

## 2023-03-14 NOTE — ED Provider Notes (Signed)
[place] EMERGENCY DEPARTMENT AT [facility] Provider Note   CSN: [id number] Arrival date & time: 03/14/23  1358     History {Add pertinent medical, surgical, social history, OB history to HPI:1} Chief Complaint  Patient presents with  . Shoulder Pain    left  . Neck Pain    Lauren Collins is a 32 y.o. female.   Shoulder Pain Associated symptoms: neck pain   Neck Pain      Home Medications Prior to Admission medications   Medication Sig Start Date End Date Taking? Authorizing Provider  cyclobenzaprine (FLEXERIL) 10 MG tablet Take 1 tablet (10 mg total) by mouth 2 (two) times daily as needed for muscle spasms. 03/08/23   Rexford Maus, DO  HYDROcodone-acetaminophen (NORCO/VICODIN) 5-325 MG tablet Take 1 tablet by mouth every 4 (four) hours as needed. 09/01/16   Tegeler, Canary Brim, MD  lidocaine (LIDODERM) 5 % Place 1 patch onto the skin daily. Remove & Discard patch within 12 hours or as directed by MD 03/08/23   Elayne Snare K, DO  metroNIDAZOLE (FLAGYL) 500 MG tablet Take 1 tablet (500 mg total) by mouth 2 (two) times daily. 03/25/20   Lamptey, Britta Mccreedy, MD  ondansetron (ZOFRAN) 4 MG tablet Take 1 tablet (4 mg total) by mouth every 8 (eight) hours as needed for nausea or vomiting. 09/01/16   Tegeler, Canary Brim, MD      Allergies    Patient has no known allergies.    Review of Systems   Review of Systems  Musculoskeletal:  Positive for neck pain.  All other systems reviewed and are negative.   Physical Exam Updated Vital Signs BP 138/83 (BP Location: Right Arm)   Pulse 88   Temp 98.6 F (37 C)   Resp 14   Ht 5' (1.524 m)   Wt 99.8 kg   SpO2 99%   BMI 42.97 kg/m  Physical Exam Vitals and nursing note reviewed.  Constitutional:      General: She is not in acute distress.    Appearance: Normal appearance.  HENT:     Head: Normocephalic and atraumatic.     Mouth/Throat:     Mouth: Mucous membranes are moist.  Eyes:      Conjunctiva/sclera: Conjunctivae normal.     Pupils: Pupils are equal, round, and reactive to light.  Neck:     Comments: Left sided paravertebral and midline cervical tenderness. ROM of neck appreciated on exam. Cardiovascular:     Rate and Rhythm: Normal rate and regular rhythm.     Pulses: Normal pulses.  Pulmonary:     Effort: Pulmonary effort is normal.     Breath sounds: Normal breath sounds.  Abdominal:     Palpations: Abdomen is soft.     Tenderness: There is no abdominal tenderness.  Musculoskeletal:        General: Tenderness present. Normal range of motion.     Cervical back: Normal range of motion. Tenderness present.     Comments: Tenderness to the left paravertebral and trapezius muscles. Anterior left shoulder tenderness. ROM, strength, and sensation of extremities intact bilaterally.    Skin:    General: Skin is warm and dry.     Findings: No rash.  Neurological:     General: No focal deficit present.     Mental Status: She is alert.     Sensory: No sensory deficit.     Motor: No weakness.  Psychiatric:        Mood and  Affect: Mood normal.        Behavior: Behavior normal.   ED Results / Procedures / Treatments   Labs (all labs ordered are listed, but only abnormal results are displayed) Labs Reviewed - No data to display  EKG None  Radiology CT Cervical Spine Wo Contrast  Result Date: 03/14/2023 CLINICAL DATA:  Cervical radiculopathy, no red flags. Neck and left shoulder pain for 1 week. EXAM: CT CERVICAL SPINE WITHOUT CONTRAST TECHNIQUE: Multidetector CT imaging of the cervical spine was performed without intravenous contrast. Multiplanar CT image reconstructions were also generated. RADIATION DOSE REDUCTION: This exam was performed according to the departmental dose-optimization program which includes automated exposure control, adjustment of the mA and/or kV according to patient size and/or use of iterative reconstruction technique. COMPARISON:  None  Available. FINDINGS: Alignment: Mild reversal of the normal cervical lordosis. No listhesis. Skull base and vertebrae: No fracture or suspicious osseous lesion. Soft tissues and spinal canal: No prevertebral fluid or swelling. No visible canal hematoma. Disc levels: At most minimal cervical spondylosis without evidence of high-grade stenosis. Upper chest: Clear lung apices. Other: Small metallic fragments along the right clavicle. IMPRESSION: No acute osseous abnormality or significant degenerative changes. Electronically Signed   By: Sebastian Ache M.D.   On: 03/14/2023 16:35   DG Shoulder Left  Result Date: 03/14/2023 CLINICAL DATA:  One-week history of worsening left shoulder pain EXAM: LEFT SHOULDER - 3 VIEW COMPARISON:  None Available. FINDINGS: Technically challenging evaluation of the axillary view due to suboptimal penetration. There is no evidence of fracture or dislocation. There is no evidence of arthropathy or other focal bone abnormality. Soft tissues are unremarkable. IMPRESSION: No acute fracture or dislocation. Electronically Signed   By: Agustin Cree M.D.   On: 03/14/2023 15:34    Procedures Procedures: not indicated. {Document cardiac monitor, telemetry assessment procedure when appropriate:1}  Medications Ordered in ED Medications  ketorolac (TORADOL) injection 60 mg (60 mg Intramuscular Given 03/14/23 1554)    ED Course/ Medical Decision Making/ A&P   {   Click here for ABCD2, HEART and other calculatorsREFRESH Note before signing :1}                              Medical Decision Making Amount and/or Complexity of Data Reviewed Radiology: ordered.  Risk Prescription drug management.   This patient presents to the ED for concern of ***, this involves an extensive number of treatment options, and is a complaint that carries with it a high risk of complications and morbidity.   Differential diagnosis includes: ***   Comorbidities  ***   Additional  History  Additional history obtained from *** External records from outside source obtained and reviewed including ***   Cardiac Monitoring / EKG  The patient was maintained on a cardiac monitor.  I personally viewed and interpreted the cardiac monitored which showed: *** with a heart rate of *** bpm.   Lab Tests  I ordered and personally interpreted labs.  The pertinent results include:  ***   Imaging Studies  I ordered imaging studies including ***  I independently visualized and interpreted imaging which showed: *** I agree with the radiologist interpretation   Consultations  I requested consultation with ***,  and discussed lab and imaging findings as well as pertinent plan - they recommend: ***   Problem List / ED Course / Critical Interventions / Medication Management  *** I ordered medications including: *** for ***  Reevaluation of the patient after these medicines showed that the patient {resolved/improved/worsened:23923::"improved"} I have reviewed the patients home medicines and have made adjustments as needed   Social Determinants of Health  ***   Test / Admission - Considered  ***   {Document critical care time when appropriate:1} {Document review of labs and clinical decision tools ie heart score, Chads2Vasc2 etc:1}  {Document your independent review of radiology images, and any outside records:1} {Document your discussion with family members, caretakers, and with consultants:1} {Document social determinants of health affecting pt's care:1} {Document your decision making why or why not admission, treatments were needed:1} Final Clinical Impression(s) / ED Diagnoses Final diagnoses:  None    Rx / DC Orders ED Discharge Orders     None

## 2023-03-14 NOTE — Discharge Instructions (Signed)
As discussed, your exam is unremarkable.  I sent a prescription of Toradol to your pharmacy.  You can take this every 8 hours as needed.   Follow-up with your PCP in 5 to 7 days reevaluation of your symptoms.  Get help right away if: You have a new injury and your pain is worse or different. You feel numb or you have tingling in the painful area.

## 2023-03-14 NOTE — ED Triage Notes (Signed)
Patient arrives with complaints of worsening neck pain and left shoulder pain x1 week. Patient was seen here recently for the same with no relief with prescription medications. Rates pain a 10/10.

## 2023-04-13 ENCOUNTER — Encounter (HOSPITAL_BASED_OUTPATIENT_CLINIC_OR_DEPARTMENT_OTHER): Payer: Self-pay | Admitting: Emergency Medicine

## 2023-04-13 ENCOUNTER — Emergency Department (HOSPITAL_BASED_OUTPATIENT_CLINIC_OR_DEPARTMENT_OTHER): Payer: Medicaid Other | Admitting: Radiology

## 2023-04-13 ENCOUNTER — Other Ambulatory Visit: Payer: Self-pay

## 2023-04-13 ENCOUNTER — Emergency Department (HOSPITAL_BASED_OUTPATIENT_CLINIC_OR_DEPARTMENT_OTHER)
Admission: EM | Admit: 2023-04-13 | Discharge: 2023-04-13 | Disposition: A | Discharge status: Home / Self Care | Payer: Medicaid Other | Attending: Emergency Medicine | Admitting: Emergency Medicine

## 2023-04-13 DIAGNOSIS — S99921A Unspecified injury of right foot, initial encounter: Secondary | ICD-10-CM | POA: Diagnosis present

## 2023-04-13 DIAGNOSIS — S90121A Contusion of right lesser toe(s) without damage to nail, initial encounter: Secondary | ICD-10-CM | POA: Insufficient documentation

## 2023-04-13 DIAGNOSIS — W1839XA Other fall on same level, initial encounter: Secondary | ICD-10-CM | POA: Diagnosis not present

## 2023-04-13 DIAGNOSIS — S90129A Contusion of unspecified lesser toe(s) without damage to nail, initial encounter: Secondary | ICD-10-CM

## 2023-04-13 NOTE — ED Notes (Signed)
Discharge paperwork given and verbally understood. 

## 2023-04-13 NOTE — ED Provider Notes (Signed)
[place] EMERGENCY DEPARTMENT AT [facility] Provider Note   CSN: [id number] Arrival date & time: 04/13/23  4098     History  Chief Complaint  Patient presents with   Toe Injury    Lauren Collins is a 32 y.o. female.  32 year old female presents with right fifth toe pain.  Injured this 2 weeks ago.  Has had dull pain to the distal tip of the toe since then.  Denies any foot or ankle pain associated with this.       Home Medications Prior to Admission medications   Medication Sig Start Date End Date Taking? Authorizing Provider  medroxyPROGESTERone (DEPO-PROVERA) 150 MG/ML injection Inject 150 mg into the muscle every 3 (three) months. 07/24/20  Yes [provider]  cyclobenzaprine (FLEXERIL) 10 MG tablet Take 1 tablet (10 mg total) by mouth 2 (two) times daily as needed for muscle spasms. 03/08/23   Rexford Maus, DO  HYDROcodone-acetaminophen (NORCO/VICODIN) 5-325 MG tablet Take 1 tablet by mouth every 4 (four) hours as needed. 09/01/16   Tegeler, Canary Brim, MD  lidocaine (LIDODERM) 5 % Place 1 patch onto the skin daily. Remove & Discard patch within 12 hours or as directed by MD 03/08/23   Elayne Snare K, DO  metroNIDAZOLE (FLAGYL) 500 MG tablet Take 1 tablet (500 mg total) by mouth 2 (two) times daily. 03/25/20   Lamptey, Britta Mccreedy, MD  ondansetron (ZOFRAN) 4 MG tablet Take 1 tablet (4 mg total) by mouth every 8 (eight) hours as needed for nausea or vomiting. 09/01/16   Tegeler, Canary Brim, MD      Allergies    Patient has no known allergies.    Review of Systems   Review of Systems  All other systems reviewed and are negative.   Physical Exam Updated Vital Signs BP 131/84 (BP Location: Right Arm)   Pulse 78   Temp 98.3 F (36.8 C) (Oral)   Resp 16   Ht 1.524 m (5')   Wt 104.3 kg   SpO2 98%   BMI 44.92 kg/m  Physical Exam Vitals and nursing note reviewed.  Constitutional:      Appearance: She is well-developed. She is not  toxic-appearing.  HENT:     Head: Normocephalic and atraumatic.  Eyes:     Conjunctiva/sclera: Conjunctivae normal.     Pupils: Pupils are equal, round, and reactive to light.  Cardiovascular:     Rate and Rhythm: Normal rate.  Pulmonary:     Effort: Pulmonary effort is normal.  Musculoskeletal:     Cervical back: Normal range of motion.       Legs:     Comments: No discoloration noted at the patient's right fifth toe  Skin:    General: Skin is warm and dry.  Neurological:     Mental Status: She is alert and oriented to person, place, and time.     ED Results / Procedures / Treatments   Labs (all labs ordered are listed, but only abnormal results are displayed) Labs Reviewed - No data to display  EKG None  Radiology No results found.  Procedures Procedures    Medications Ordered in ED Medications - No data to display  ED Course/ Medical Decision Making/ A&P                                 Medical Decision Making Amount and/or Complexity of Data Reviewed Radiology: ordered.  X-ray of right fifth toe without evidence of fracture.  Will discharge home        Final Clinical Impression(s) / ED Diagnoses Final diagnoses:  None    Rx / DC Orders ED Discharge Orders     None         Lorre Nick, MD 04/13/23 1054

## 2023-04-13 NOTE — ED Triage Notes (Signed)
Pt caox4, ambulatory c/o R pinky toe pain stating approx 2 weeks ago she fell and pain has not gotten better, states she is concerned she broke her toe. Pt denies any other injury or complaint.

## 2023-10-22 ENCOUNTER — Other Ambulatory Visit: Payer: Self-pay

## 2023-10-22 ENCOUNTER — Emergency Department (HOSPITAL_BASED_OUTPATIENT_CLINIC_OR_DEPARTMENT_OTHER)
Admission: EM | Admit: 2023-10-22 | Discharge: 2023-10-22 | Disposition: A | Discharge status: Home / Self Care | Attending: Emergency Medicine | Admitting: Emergency Medicine

## 2023-10-22 ENCOUNTER — Emergency Department (HOSPITAL_BASED_OUTPATIENT_CLINIC_OR_DEPARTMENT_OTHER): Admitting: Radiology

## 2023-10-22 DIAGNOSIS — F430 Acute stress reaction: Secondary | ICD-10-CM | POA: Insufficient documentation

## 2023-10-22 DIAGNOSIS — R079 Chest pain, unspecified: Secondary | ICD-10-CM

## 2023-10-22 DIAGNOSIS — F439 Reaction to severe stress, unspecified: Secondary | ICD-10-CM

## 2023-10-22 DIAGNOSIS — R0789 Other chest pain: Secondary | ICD-10-CM | POA: Insufficient documentation

## 2023-10-22 LAB — CBC WITH DIFFERENTIAL/PLATELET
Abs Immature Granulocytes: 0.02 10*3/uL (ref 0.00–0.07)
Basophils Absolute: 0 10*3/uL (ref 0.0–0.1)
Basophils Relative: 0 %
Eosinophils Absolute: 0.2 10*3/uL (ref 0.0–0.5)
Eosinophils Relative: 2 %
HCT: 37.4 % (ref 36.0–46.0)
Hemoglobin: 12.2 g/dL (ref 12.0–15.0)
Immature Granulocytes: 0 %
Lymphocytes Relative: 30 %
Lymphs Abs: 2.1 10*3/uL (ref 0.7–4.0)
MCH: 26.3 pg (ref 26.0–34.0)
MCHC: 32.6 g/dL (ref 30.0–36.0)
MCV: 80.8 fL (ref 80.0–100.0)
Monocytes Absolute: 0.4 10*3/uL (ref 0.1–1.0)
Monocytes Relative: 5 %
Neutro Abs: 4.4 10*3/uL (ref 1.7–7.7)
Neutrophils Relative %: 63 %
Platelets: 317 10*3/uL (ref 150–400)
RBC: 4.63 MIL/uL (ref 3.87–5.11)
RDW: 13.2 % (ref 11.5–15.5)
WBC: 7.2 10*3/uL (ref 4.0–10.5)
nRBC: 0 % (ref 0.0–0.2)

## 2023-10-22 LAB — BASIC METABOLIC PANEL WITH GFR
Anion gap: 10 (ref 5–15)
BUN: 12 mg/dL (ref 6–20)
CO2: 23 mmol/L (ref 22–32)
Calcium: 9.4 mg/dL (ref 8.9–10.3)
Chloride: 107 mmol/L (ref 98–111)
Creatinine, Ser: 0.97 mg/dL (ref 0.44–1.00)
GFR, Estimated: >60 mL/min (ref >60)
Glucose, Bld: 101 mg/dL — ABNORMAL HIGH (ref 70–99)
Potassium: 3.6 mmol/L (ref 3.5–5.1)
Sodium: 140 mmol/L (ref 135–145)

## 2023-10-22 LAB — TROPONIN T, HIGH SENSITIVITY: Troponin T High Sensitivity: <15 ng/L (ref <19)

## 2023-10-22 NOTE — ED Provider Notes (Signed)
 [place] EMERGENCY DEPARTMENT AT [facility] Provider Note   CSN: [id number] Arrival date & time: 10/22/23  9166     Patient presents with: Chest Pain   Lauren Collins is a 33 y.o. female.   Patient is a 33 year old female with no significant past medical history presenting to the emergency department with chest pain.  Patient states for the last 3 days she has had chest pain that send coming and going.  She states that she feels a pressure type of pain and occasionally will have sharp pains on the right side of her chest and sharp pains that feel like a muscle spasm down her left arm.  She states that the pain usually last for a few minutes and will go away on its own.  She states that sometimes it will come and go several times in 1 hour and other times will be several hours between the pain.  She denies any associated shortness of breath, lightheadedness or dizziness, diaphoresis, nausea or vomiting.  She denies any lower extremity swelling.  She denies any history of VTE, any recent hospitalization or surgery, any recent long travel in the car or plane, estrogen use or cancer history.  She states that she has been under a lot of increased stress recently.  She denies any family history of early cardiac disease.  The history is provided by the patient.  Chest Pain      Prior to Admission medications   Medication Sig Start Date End Date Taking? Authorizing Provider  cyclobenzaprine  (FLEXERIL ) 10 MG tablet Take 1 tablet (10 mg total) by mouth 2 (two) times daily as needed for muscle spasms. 03/08/23   Kingsley, Betzayda Braxton K, DO  HYDROcodone -acetaminophen  (NORCO/VICODIN) 5-325 MG tablet Take 1 tablet by mouth every 4 (four) hours as needed. 09/01/16   Tegeler, Lonni PARAS, MD  lidocaine  (LIDODERM ) 5 % Place 1 patch onto the skin daily. Remove & Discard patch within 12 hours or as directed by MD 03/08/23   Kingsley, Taiesha Bovard K, DO  medroxyPROGESTERone (DEPO-PROVERA) 150 MG/ML  injection Inject 150 mg into the muscle every 3 (three) months. 07/24/20   [provider]  metroNIDAZOLE  (FLAGYL ) 500 MG tablet Take 1 tablet (500 mg total) by mouth 2 (two) times daily. 03/25/20   Lamptey, Aleene KIDD, MD  ondansetron  (ZOFRAN ) 4 MG tablet Take 1 tablet (4 mg total) by mouth every 8 (eight) hours as needed for nausea or vomiting. 09/01/16   Tegeler, Lonni PARAS, MD    Allergies: Patient has no known allergies.    Review of Systems  Cardiovascular:  Positive for chest pain.    Updated Vital Signs BP (!) 131/95   Pulse 93   Temp 99 F (37.2 C) (Oral)   Resp 18   Ht 5' (1.524 m)   Wt 104.3 kg   LMP 10/20/2023 (Approximate)   SpO2 100%   BMI 44.92 kg/m   Physical Exam Vitals and nursing note reviewed.  Constitutional:      General: She is not in acute distress.    Appearance: She is well-developed. She is obese.  HENT:     Head: Normocephalic.   Eyes:     Extraocular Movements: Extraocular movements intact.    Cardiovascular:     Rate and Rhythm: Normal rate and regular rhythm.     Heart sounds: Normal heart sounds.  Pulmonary:     Effort: Pulmonary effort is normal.     Breath sounds: Normal breath sounds.  Chest:  Chest wall: No tenderness.  Abdominal:     Palpations: Abdomen is soft.     Tenderness: There is no abdominal tenderness.   Musculoskeletal:        General: Normal range of motion.     Cervical back: Normal range of motion and neck supple.     Right lower leg: No edema.     Left lower leg: No edema.   Skin:    General: Skin is warm and dry.   Neurological:     General: No focal deficit present.     Mental Status: She is alert and oriented to person, place, and time.   Psychiatric:        Mood and Affect: Mood normal.        Behavior: Behavior normal.     (all labs ordered are listed, but only abnormal results are displayed) Labs Reviewed  BASIC METABOLIC PANEL WITH GFR - Abnormal; Notable for the following  components:      Result Value   Glucose, Bld 101 (*)    All other components within normal limits  CBC WITH DIFFERENTIAL/PLATELET  TROPONIN T, HIGH SENSITIVITY    EKG: EKG Interpretation Date/Time:  Friday October 22 2023 08:41:38 EDT Ventricular Rate:  88 PR Interval:  127 QRS Duration:  86 QT Interval:  352 QTC Calculation: 426 R Axis:   50  Text Interpretation: Sinus rhythm Borderline T wave abnormalities No significant change since last tracing Confirmed by Ellouise Fine ([id number]) on 10/22/2023 8:56:47 AM  Radiology: [facility] Chest 2 View Result Date: 10/22/2023 CLINICAL DATA:  Chest pain. EXAM: CHEST - 2 VIEW COMPARISON:  February 20, 2018. FINDINGS: The heart size and mediastinal contours are within normal limits. Both lungs are clear. The visualized skeletal structures are unremarkable. IMPRESSION: No active cardiopulmonary disease. Electronically Signed   By: Lynwood Landy Raddle M.D.   On: 10/22/2023 09:16     Procedures   Medications Ordered in the ED - No data to display  Clinical Course as of 10/22/23 1052  Fri Oct 22, 2023  1029 Labs and CXR within normal range. Symptoms ongoing for several days so single troponin is sufficient. Suspect pain is stress induced. Recommend Tylenol  as needed and primary care follow up. [VK]    Clinical Course User Index [VK] Kingsley, Marai Teehan K, DO                                 Medical Decision Making This patient presents to the ED with chief complaint(s) of chest pain with no pertinent past medical history which further complicates the presenting complaint. The complaint involves an extensive differential diagnosis and also carries with it a high risk of complications and morbidity.    The differential diagnosis includes ACS, arrhythmia, anemia, pneumonia, pneumothorax, pulmonary edema, pleural effusion, gastritis, GERD, MSK pain, stress-induced, she is PERC negative making PE unlikely  Additional history obtained: Additional history  obtained from N/A Records reviewed Primary Care Documents  ED Course and Reassessment: On patient's arrival she is hemodynamically stable in no acute distress.  EKG on arrival showed normal sinus rhythm without acute ischemic changes.  She will have labs including troponin and chest x-ray performed.  She declined any pain control at this time and will be closely reassessed.  Independent labs interpretation:  The following labs were independently interpreted: within normal range  Independent visualization of imaging: - I independently visualized the following imaging with scope  of interpretation limited to determining acute life threatening conditions related to emergency care: CXR, which revealed no acute disease  Consultation: - Consulted or discussed management/test interpretation w/ external professional: N/A  Consideration for admission or further workup: Patient has no emergent conditions requiring admission or further work-up at this time and is stable for discharge home with primary care follow-up  Social Determinants of health: N/A    Amount and/or Complexity of Data Reviewed Labs: ordered. Radiology: ordered.       Final diagnoses:  Nonspecific chest pain  Stress at home    ED Discharge Orders     None          Ellouise Richerd POUR, DO 10/22/23 1052

## 2023-10-22 NOTE — Discharge Instructions (Signed)
 You were seen in the emergency department for your chest pain.  Your workup showed no signs of heart attack or stress on your heart or abnormalities within your lungs.  Your chest pain could be stress-induced and you can take Tylenol  as needed for pain.  You should follow-up with your primary doctor in the next few days to have your symptoms rechecked.  You can return to the emergency department for significantly worsening pain or if you have severe shortness of breath or any other new or concerning symptoms.

## 2023-10-22 NOTE — ED Notes (Signed)
 Discharge paperwork given and verbally understood.

## 2023-10-22 NOTE — ED Triage Notes (Signed)
 Pt caox4, ambulatory c/o CP across both side, down L arm x2 days.

## 2023-11-30 ENCOUNTER — Emergency Department (HOSPITAL_BASED_OUTPATIENT_CLINIC_OR_DEPARTMENT_OTHER)
Admission: EM | Admit: 2023-11-30 | Discharge: 2023-11-30 | Disposition: A | Discharge status: Home / Self Care | Attending: Emergency Medicine | Admitting: Emergency Medicine

## 2023-11-30 ENCOUNTER — Other Ambulatory Visit: Payer: Self-pay

## 2023-11-30 DIAGNOSIS — K029 Dental caries, unspecified: Secondary | ICD-10-CM | POA: Diagnosis not present

## 2023-11-30 DIAGNOSIS — K0889 Other specified disorders of teeth and supporting structures: Secondary | ICD-10-CM

## 2023-11-30 MED ORDER — AMOXICILLIN-POT CLAVULANATE 875-125 MG PO TABS
1.0000 | ORAL_TABLET | Freq: Once | ORAL | Status: DC
  Filled 2023-11-30: qty 1

## 2023-11-30 MED ORDER — HYDROCODONE-ACETAMINOPHEN 5-325 MG PO TABS: 1.0000 | ORAL_TABLET | Freq: Four times a day (QID) | ORAL | 0 refills | Status: AC | PRN

## 2023-11-30 MED ORDER — AMOXICILLIN-POT CLAVULANATE 875-125 MG PO TABS: 1.0000 | ORAL_TABLET | Freq: Two times a day (BID) | ORAL | 0 refills | Status: AC

## 2023-11-30 NOTE — ED Provider Notes (Signed)
 [place] EMERGENCY DEPARTMENT AT [facility] Provider Note   CSN: [id number] Arrival date & time: 11/30/23  2120     Patient presents with: Dental Pain   Lauren Collins is a 33 y.o. female.   Patient presents to the emergency department for evaluation of right lower jaw pain.  Symptoms started about the day ago.  Pain is spread down to her chin.  She feels subjective swelling.  No difficulty breathing or swallowing.  Patient has tried over-the-counter topical medications with minimal improvement.  No ear pain or fevers.  She has a history of pain in this tooth in the past due to a cavity.       Prior to Admission medications   Medication Sig Start Date End Date Taking? Authorizing Provider  amoxicillin -clavulanate (AUGMENTIN ) 875-125 MG tablet Take 1 tablet by mouth every 12 (twelve) hours. 11/30/23  Yes Nasteho Glantz, PA-C  HYDROcodone -acetaminophen  (NORCO/VICODIN) 5-325 MG tablet Take 1 tablet by mouth every 6 (six) hours as needed for severe pain (pain score 7-10). 11/30/23  Yes Desiderio Chew, PA-C  medroxyPROGESTERone (DEPO-PROVERA) 150 MG/ML injection Inject 150 mg into the muscle every 3 (three) months. 07/24/20   [provider]    Allergies: Patient has no known allergies.    Review of Systems  Updated Vital Signs BP 132/88 (BP Location: Right Arm)   Pulse 85   Temp 98.4 F (36.9 C) (Oral)   Resp 18   Ht 5' (1.524 m)   Wt 104.3 kg   SpO2 97%   BMI 44.92 kg/m   Physical Exam Vitals and nursing note reviewed.  Constitutional:      Appearance: She is well-developed.  HENT:     Head: Normocephalic and atraumatic.     Jaw: No trismus.     Right Ear: External ear normal.     Left Ear: External ear normal.     Nose: Nose normal.     Mouth/Throat:     Mouth: Mucous membranes are moist.     Dentition: Abnormal dentition. Dental caries present. No dental abscesses.     Pharynx: Uvula midline. No uvula swelling.     Tonsils: No tonsillar abscesses.      Comments: Patient with a cavity noted in tooth #30, mild gingival swelling.  No gross abscess.  Patient is tender over the base of this tooth. Eyes:     Conjunctiva/sclera: Conjunctivae normal.  Neck:     Comments: No neck swelling or Ludwig's angina Musculoskeletal:     Cervical back: Normal range of motion and neck supple.  Lymphadenopathy:     Cervical: No cervical adenopathy.  Skin:    General: Skin is warm and dry.  Neurological:     Mental Status: She is alert.     (all labs ordered are listed, but only abnormal results are displayed) Labs Reviewed - No data to display  EKG: None  Radiology: No results found.   Procedures   Medications Ordered in the ED  amoxicillin -clavulanate (AUGMENTIN ) 875-125 MG per tablet 1 tablet (has no administration in time range)                                    Medical Decision Making Risk Prescription drug management.   Patient presents for dental pain. They do not have a fever and do not appear septic. Exam unconcerning for Ludwig's angina or other deep tissue infection in neck and I  do not feel that advanced imaging is indicated at this time. Low suspicion for PTA, RPA, epiglottis based on exam.   Patient will be treated for dental infection with antibiotic. Encouraged tylenol /NSAIDs as prescribed or as directed on the packaging for pain. Encouraged follow-up with a dentist for definitive and long-term management.       Final diagnoses:  Pain, dental    ED Discharge Orders          Ordered    amoxicillin -clavulanate (AUGMENTIN ) 875-125 MG tablet  Every 12 hours        11/30/23 2222    HYDROcodone -acetaminophen  (NORCO/VICODIN) 5-325 MG tablet  Every 6 hours PRN        11/30/23 2222               Desiderio Chew, PA-C 11/30/23 2224    Mannie Pac T, DO 12/01/23 1512

## 2023-11-30 NOTE — ED Triage Notes (Signed)
 Pt POV reporting L side mouth pain and swelling due to needing root canal, surgery scheduled in a few months.

## 2023-11-30 NOTE — Discharge Instructions (Addendum)
 Please read and follow all provided instructions.  Your diagnoses today include:  1. Pain, dental     The exam and treatment you received today has been provided on an emergency basis only. This is not a substitute for complete medical or dental care.  Tests performed today include: Vital signs. See below for your results today.   Medications prescribed:  Vicodin (hydrocodone /acetaminophen ) - narcotic pain medication  DO NOT drive or perform any activities that require you to be awake and alert because this medicine can make you drowsy. BE VERY CAREFUL not to take multiple medicines containing Tylenol  (also called acetaminophen ). Doing so can lead to an overdose which can damage your liver and cause liver failure and possibly death.  Augmentin  - antibiotic  You have been prescribed an antibiotic medicine: take the entire course of medicine even if you are feeling better. Stopping early can cause the antibiotic not to work.  Please use over-the-counter NSAID medications (ibuprofen , naproxen ) or Tylenol  (acetaminophen ) as directed on the packaging for pain -- as long as you do not have any reasons avoid these medications. Reasons to avoid NSAID medications include: weak kidneys, a history of bleeding in your stomach or gut, or uncontrolled high blood pressure or previous heart attack. Reasons to avoid Tylenol  include: liver problems or ongoing alcohol use. Never take more than 4000mg  or 8 Extra strength Tylenol  in a 24 hour period.     Take any prescribed medications only as directed.  Home care instructions:  Follow any educational materials contained in this packet.  Follow-up instructions: Please follow-up with your dentist for further evaluation of your symptoms.   Dental Assistance: See attached dental referral and/or resource guide.   Return instructions:  Please return to the Emergency Department if you experience worsening symptoms. Please return if you develop a fever, you  develop more swelling in your face or neck, you have trouble breathing or swallowing food. Please return if you have any other emergent concerns.  Additional Information:  Your vital signs today were: BP 132/88 (BP Location: Right Arm)   Pulse 85   Temp 98.4 F (36.9 C) (Oral)   Resp 18   Ht 5' (1.524 m)   Wt 104.3 kg   SpO2 97%   BMI 44.92 kg/m  If your blood pressure (BP) was elevated above 135/85 this visit, please have this repeated by your doctor within one month. --------------

## 2023-11-30 NOTE — ED Notes (Signed)
 Pt left ED without d/c paperwork and prior to RN assessment. Called patient on the phone on d/c paperwork verbally reviewed with Rx information given.

## 2023-11-30 NOTE — ED Notes (Signed)
 ED Provider at bedside.

## 2023-12-15 ENCOUNTER — Emergency Department (HOSPITAL_BASED_OUTPATIENT_CLINIC_OR_DEPARTMENT_OTHER)
Admission: EM | Admit: 2023-12-15 | Discharge: 2023-12-15 | Disposition: A | Discharge status: Home / Self Care | Attending: Emergency Medicine | Admitting: Emergency Medicine

## 2023-12-15 ENCOUNTER — Emergency Department (HOSPITAL_BASED_OUTPATIENT_CLINIC_OR_DEPARTMENT_OTHER)

## 2023-12-15 ENCOUNTER — Other Ambulatory Visit: Payer: Self-pay

## 2023-12-15 DIAGNOSIS — M79641 Pain in right hand: Secondary | ICD-10-CM | POA: Insufficient documentation

## 2023-12-15 NOTE — ED Provider Notes (Signed)
 [place] EMERGENCY DEPARTMENT AT [facility] Provider Note   CSN: [id number] Arrival date & time: 12/15/23  1739     Patient presents with: Hand Pain   Lauren Collins is a 33 y.o. female.   Patient status post fall on Thursday.  Injuring her thenar eminence of her right thumb hand.  Patient been wearing a Velcro brace that she got over-the-counter.  Hand does not seem to be improving.  Denies any other injury.  Past medical history of anemia kidney stones gestational diabetes.       Prior to Admission medications   Medication Sig Start Date End Date Taking? Authorizing Provider  amoxicillin -clavulanate (AUGMENTIN ) 875-125 MG tablet Take 1 tablet by mouth every 12 (twelve) hours. 11/30/23   Geiple, Joshua, PA-C  HYDROcodone -acetaminophen  (NORCO/VICODIN) 5-325 MG tablet Take 1 tablet by mouth every 6 (six) hours as needed for severe pain (pain score 7-10). 11/30/23   Desiderio Chew, PA-C  medroxyPROGESTERone (DEPO-PROVERA) 150 MG/ML injection Inject 150 mg into the muscle every 3 (three) months. 07/24/20   [provider]    Allergies: Patient has no known allergies.    Review of Systems  Constitutional:  Negative for chills and fever.  HENT:  Negative for ear pain and sore throat.   Eyes:  Negative for pain and visual disturbance.  Respiratory:  Negative for cough and shortness of breath.   Cardiovascular:  Negative for chest pain and palpitations.  Gastrointestinal:  Negative for abdominal pain and vomiting.  Genitourinary:  Negative for dysuria and hematuria.  Musculoskeletal:  Positive for joint swelling. Negative for arthralgias and back pain.  Skin:  Negative for color change and rash.  Neurological:  Negative for seizures and syncope.  All other systems reviewed and are negative.   Updated Vital Signs BP (!) 133/95 (BP Location: Right Arm)   Pulse 83   Temp 97.9 F (36.6 C)   Resp 18   SpO2 100%   Physical Exam Vitals and nursing note reviewed.   Constitutional:      General: She is not in acute distress.    Appearance: Normal appearance. She is well-developed.  HENT:     Head: Normocephalic and atraumatic.  Eyes:     Extraocular Movements: Extraocular movements intact.     Conjunctiva/sclera: Conjunctivae normal.     Pupils: Pupils are equal, round, and reactive to light.  Cardiovascular:     Rate and Rhythm: Normal rate and regular rhythm.     Heart sounds: No murmur heard. Pulmonary:     Effort: Pulmonary effort is normal. No respiratory distress.     Breath sounds: Normal breath sounds.  Abdominal:     Palpations: Abdomen is soft.     Tenderness: There is no abdominal tenderness.  Musculoskeletal:        General: Swelling, tenderness and signs of injury present.     Cervical back: Normal range of motion and neck supple.     Comments: A lot of swelling to the thenar eminence of the right hand.  No significant swelling to the thumb.  Patient has pretty good opposition of the thumb and little finger.  Pretty good flexion and extension of the thumb.  Radial pulse 2+.  No snuffbox tenderness.  Skin:    General: Skin is warm and dry.     Capillary Refill: Capillary refill takes less than 2 seconds.  Neurological:     General: No focal deficit present.     Mental Status: She is alert and oriented  to person, place, and time.  Psychiatric:        Mood and Affect: Mood normal.     (all labs ordered are listed, but only abnormal results are displayed) Labs Reviewed - No data to display  EKG: None  Radiology: DG Finger Thumb Right Result Date: 12/15/2023 CLINICAL DATA:  Right thumb pain after fall last week. EXAM: RIGHT THUMB 2+V COMPARISON:  None Available. FINDINGS: There is no evidence of fracture or dislocation. There is no evidence of arthropathy or other focal bone abnormality. Soft tissues are unremarkable. IMPRESSION: Negative. Electronically Signed   By: Lynwood Landy Raddle M.D.   On: 12/15/2023 18:06   DG Hand  Complete Right Result Date: 12/15/2023 CLINICAL DATA:  Right hand pain after fall last week. EXAM: RIGHT HAND - COMPLETE 3+ VIEW COMPARISON:  None Available. FINDINGS: There is no evidence of fracture or dislocation. There is no evidence of arthropathy or other focal bone abnormality. Soft tissues are unremarkable. IMPRESSION: Negative. Electronically Signed   By: Lynwood Landy Raddle M.D.   On: 12/15/2023 18:05     Procedures   Medications Ordered in the ED - No data to display                                  Medical Decision Making Amount and/or Complexity of Data Reviewed Radiology: ordered.   Trays of right thumb and right hand without any bony abnormalities.  Clinically there is a lot of swelling to the thenar eminence.  With some tenderness.  No real swelling of the thumb.  Pretty good flexion and extension of the thumb itself and patient can almost oppose it all the way over to the little finger.  No snuffbox tenderness.  Radial pulse 2+.  Good cap refill to the finger and thumb and sensations intact on thumb.  Final diagnoses:  Hand pain, right    ED Discharge Orders     None          Geraldene Hamilton, MD 12/15/23 [phone]

## 2023-12-15 NOTE — Discharge Instructions (Addendum)
 Wear the brace.  Give Dr. Shari call from [facility] for follow-up.  Would recommend 800 mg of Motrin  every 8 hours for the pain.  Can also take 2 extra strength Tylenol  every 8 hours for pain as well.

## 2023-12-15 NOTE — ED Triage Notes (Signed)
 C/o R hand pain after fall last week. Reports sharp pain in thumb. States OTC meds and brace not providing relief.

## 2023-12-15 NOTE — ED Notes (Signed)
 DC paperwork given and verbally understood.... Pt DC before assessment could be performed.SABRASABRA

## 2023-12-26 ENCOUNTER — Other Ambulatory Visit: Payer: Self-pay

## 2023-12-26 ENCOUNTER — Encounter (HOSPITAL_BASED_OUTPATIENT_CLINIC_OR_DEPARTMENT_OTHER): Payer: Self-pay

## 2023-12-26 ENCOUNTER — Emergency Department (HOSPITAL_BASED_OUTPATIENT_CLINIC_OR_DEPARTMENT_OTHER)
Admission: EM | Admit: 2023-12-26 | Discharge: 2023-12-26 | Disposition: A | Discharge status: Home / Self Care | Attending: Emergency Medicine | Admitting: Emergency Medicine

## 2023-12-26 DIAGNOSIS — S0501XA Injury of conjunctiva and corneal abrasion without foreign body, right eye, initial encounter: Secondary | ICD-10-CM | POA: Insufficient documentation

## 2023-12-26 DIAGNOSIS — Z23 Encounter for immunization: Secondary | ICD-10-CM | POA: Insufficient documentation

## 2023-12-26 DIAGNOSIS — X58XXXA Exposure to other specified factors, initial encounter: Secondary | ICD-10-CM | POA: Diagnosis not present

## 2023-12-26 MED ORDER — FLUORESCEIN SODIUM 1 MG OP STRP
1.0000 | ORAL_STRIP | Freq: Once | OPHTHALMIC | Status: AC
  Administered 2023-12-26: 1 via OPHTHALMIC
  Filled 2023-12-26: qty 1

## 2023-12-26 MED ORDER — TETRACAINE HCL 0.5 % OP SOLN
1.0000 [drp] | Freq: Once | OPHTHALMIC | Status: AC
  Administered 2023-12-26: 1 [drp] via OPHTHALMIC
  Filled 2023-12-26: qty 4

## 2023-12-26 MED ORDER — TETANUS-DIPHTH-ACELL PERTUSSIS 5-2.5-18.5 LF-MCG/0.5 IM SUSY
0.5000 mL | PREFILLED_SYRINGE | Freq: Once | INTRAMUSCULAR | Status: AC
  Administered 2023-12-26: 0.5 mL via INTRAMUSCULAR
  Filled 2023-12-26: qty 0.5

## 2023-12-26 MED ORDER — POLYMYXIN B-TRIMETHOPRIM 10000-0.1 UNIT/ML-% OP SOLN
1.0000 [drp] | OPHTHALMIC | Status: DC
  Administered 2023-12-26: 1 [drp] via OPHTHALMIC
  Filled 2023-12-26: qty 10

## 2023-12-26 NOTE — ED Provider Notes (Signed)
 [place] EMERGENCY DEPARTMENT AT [facility] Provider Note   CSN: [id number] Arrival date & time: 12/26/23  2018     Patient presents with: Foreign Body in Eye (R)   Lauren Collins is a 33 y.o. female.   The history is provided by the patient. No language interpreter was used.  Foreign Body in Eye     33 year old female presenting with complaints of eye irritation.  Patient report earlier today she was walking and while she was grabbing a fake branch of flowers or something flew and struck her right eye.  Since then she endorsed having irritation about the eye with excessive tearing and blurriness.  Endorses foreign body sensation in eye.  She does not wear contact lenses or prescription glasses.  She denies any specific treatment tried.  Unsure last tetanus.  Prior to Admission medications   Medication Sig Start Date End Date Taking? Authorizing Provider  amoxicillin -clavulanate (AUGMENTIN ) 875-125 MG tablet Take 1 tablet by mouth every 12 (twelve) hours. 11/30/23   Geiple, Joshua, PA-C  HYDROcodone -acetaminophen  (NORCO/VICODIN) 5-325 MG tablet Take 1 tablet by mouth every 6 (six) hours as needed for severe pain (pain score 7-10). 11/30/23   Desiderio Chew, PA-C  medroxyPROGESTERone (DEPO-PROVERA) 150 MG/ML injection Inject 150 mg into the muscle every 3 (three) months. 07/24/20   [provider]    Allergies: Patient has no known allergies.    Review of Systems  Eyes:  Positive for photophobia, pain, redness and visual disturbance. Negative for discharge and itching.    Updated Vital Signs BP 127/76   Pulse 84   Temp 98.1 F (36.7 C)   Resp 16   SpO2 98%   Physical Exam Vitals and nursing note reviewed.  Constitutional:      General: She is not in acute distress.    Appearance: She is well-developed. She is obese.  HENT:     Head: Atraumatic.  Eyes:     General: Lids are normal. Lids are everted, no foreign bodies appreciated. Vision grossly intact.  Gaze aligned appropriately.     Conjunctiva/sclera:     Right eye: Right conjunctiva is injected. No chemosis, exudate or hemorrhage.    Left eye: Left conjunctiva is not injected. No chemosis, exudate or hemorrhage.    Pupils: Pupils are equal, round, and reactive to light.     Right eye: Corneal abrasion present.     Slit lamp exam:    Right eye: No corneal flare, corneal ulcer, foreign body, hyphema or hypopyon.   Pulmonary:     Effort: Pulmonary effort is normal.  Musculoskeletal:     Cervical back: Neck supple.  Skin:    Findings: No rash.  Neurological:     Mental Status: She is alert.  Psychiatric:        Mood and Affect: Mood normal.     (all labs ordered are listed, but only abnormal results are displayed) Labs Reviewed - No data to display  EKG: None  Radiology: No results found.   Procedures   Medications Ordered in the ED  fluorescein  ophthalmic strip 1 strip (has no administration in time range)  tetracaine  (PONTOCAINE) 0.5 % ophthalmic solution 1 drop (has no administration in time range)                                    Medical Decision Making Risk Prescription drug management.   BP 127/76  Pulse 84   Temp 98.1 F (36.7 C)   Resp 16   SpO2 98%   26:8 PM  33 year old female presenting with complaints of eye irritation.  Patient report earlier today she was walking and while she was grabbing a fake branch of flowers or something flew and struck her right eye.  Since then she endorsed having irritation about the eye with excessive tearing and blurriness.  Endorses foreign body sensation in eye.  She does not wear contact lenses or prescription glasses.  She denies any specific treatment tried.  Unsure last tetanus   Exam notable for small corneal abrasion involving the right eye.  Conjunctiva is injected with limbic sparing.  No foreign body noted.  Will update tetanus and will give Polytrim  as treatment.  Ophthalmology referral given as  needed.  Low suspicion for iritis, uveitis, acute angle glaucoma, or other ocular emergency.     Final diagnoses:  Abrasion of right cornea, initial encounter    ED Discharge Orders     None          Nivia Colon, PA-C 12/26/23 2221    Lenor Hollering, MD 12/26/23 [phone]

## 2023-12-26 NOTE — Discharge Instructions (Signed)
 You have been diagnosed with a corneal abrasion.  Please apply 1 to 2 drops of eyedrop to your right eye every 4 hours for the next 5 days to decrease risk of infection.  You may take over-the-counter Tylenol  or ibuprofen  as needed for aches and pain.  You may follow-up with eye specialist if your condition worsens.

## 2023-12-26 NOTE — ED Triage Notes (Signed)
 Pt c/o something's in my eye. States that she was working w a Dealer, I think a piece of that flew into my R eye. Incident around 1p, sensitivity to light. Blurred vision to R eye.

## 2024-02-24 ENCOUNTER — Emergency Department (HOSPITAL_BASED_OUTPATIENT_CLINIC_OR_DEPARTMENT_OTHER)
Admission: EM | Admit: 2024-02-24 | Discharge: 2024-02-24 | Disposition: A | Discharge status: Home / Self Care | Attending: Emergency Medicine | Admitting: Emergency Medicine

## 2024-02-24 ENCOUNTER — Encounter (HOSPITAL_BASED_OUTPATIENT_CLINIC_OR_DEPARTMENT_OTHER): Payer: Self-pay

## 2024-02-24 ENCOUNTER — Other Ambulatory Visit: Payer: Self-pay

## 2024-02-24 DIAGNOSIS — N6012 Diffuse cystic mastopathy of left breast: Secondary | ICD-10-CM | POA: Diagnosis not present

## 2024-02-24 DIAGNOSIS — N6011 Diffuse cystic mastopathy of right breast: Secondary | ICD-10-CM | POA: Insufficient documentation

## 2024-02-24 NOTE — ED Notes (Signed)
 Reviewed AVS/discharge instruction with patient. Time allotted for and all questions answered. Patient is agreeable for d/c and escorted to ed exit by staff.

## 2024-02-24 NOTE — ED Notes (Signed)
 Dr. Rogelia to bedside completing breast exam.

## 2024-02-24 NOTE — ED Provider Notes (Signed)
 [place] EMERGENCY DEPARTMENT AT [facility] Provider Note   CSN: [id number] Arrival date & time: 02/24/24  9078     History Chief Complaint  Patient presents with   Breast Problem    Lumps    HPI: Lauren Collins is a 33 y.o. female with history pertinent anemia, nephrolithiasis, elevated BMI, dysmenorrhea who presents complaining of breast masses and tenderness. Patient arrived via POV.  History provided by patient.  No interpreter required during this encounter.  Patient reports that she has a past medical history of dysmenorrhea, was previously on the Depo shot, however recently changed that due to breakthrough bleeding.  Reports that her period started last week, and she developed tenderness and a palpable mass in her right breast.  Reports that this week she also noticed a tender mass in her left breast.  Reports that she was recently informed by her parents of a family history and both maternal and paternal aunts of breast cancer in the family, as young as the 30s to 65s.  Patient denies breast cancer in her mother.  Reports that she has chronic fatigue over several months, however no other new or different symptoms over the past several days, no fever, chills, chest pain, shortness of breath, nausea, vomiting, diarrhea.  Denies possibility of pregnancy, states that she has not been sexually active in approximately 2 years.  Patient's recorded medical, surgical, social, medication list and allergies were reviewed in the Snapshot window as part of the initial history.   Prior to Admission medications   Medication Sig Start Date End Date Taking? Authorizing Provider  amoxicillin -clavulanate (AUGMENTIN ) 875-125 MG tablet Take 1 tablet by mouth every 12 (twelve) hours. 11/30/23   Geiple, Joshua, PA-C  HYDROcodone -acetaminophen  (NORCO/VICODIN) 5-325 MG tablet Take 1 tablet by mouth every 6 (six) hours as needed for severe pain (pain score 7-10). 11/30/23   Desiderio Chew, PA-C   medroxyPROGESTERone (DEPO-PROVERA) 150 MG/ML injection Inject 150 mg into the muscle every 3 (three) months. 07/24/20   [provider]     Allergies: Patient has no known allergies.   Review of Systems   ROS as per HPI  Physical Exam Updated Vital Signs BP (!) 141/81 (BP Location: Right Arm)   Pulse 71   Temp 98.4 F (36.9 C) (Oral)   Resp 16   SpO2 98%  Physical Exam Vitals and nursing note reviewed.  Constitutional:      General: She is not in acute distress.    Appearance: She is well-developed.  HENT:     Head: Normocephalic and atraumatic.  Eyes:     Conjunctiva/sclera: Conjunctivae normal.  Cardiovascular:     Rate and Rhythm: Normal rate and regular rhythm.     Heart sounds: No murmur heard. Pulmonary:     Effort: Pulmonary effort is normal. No respiratory distress.     Breath sounds: Normal breath sounds.  Chest:  Breasts:    Right: Mass present. No bleeding, inverted nipple, nipple discharge or skin change.     Left: Mass present. No bleeding, inverted nipple, nipple discharge or skin change.     Comments: Bilateral breasts without nipple inversion, discharge.  Patient does have shotty masses palpable in bilateral lateral aspects of breasts without fluctuance, induration, erythema, warmth, mildly tender to palpation, mobile Abdominal:     Palpations: Abdomen is soft.  Musculoskeletal:     Cervical back: Neck supple.  Skin:    General: Skin is warm and dry.     Capillary Refill: Capillary refill  takes less than 2 seconds.  Neurological:     Mental Status: She is alert.  Psychiatric:        Mood and Affect: Mood normal.     ED Course/ Medical Decision Making/ A&P    Procedures Procedures   Medications Ordered in ED Medications - No data to display  Medical Decision Making:   Lauren Collins is a 33 y.o. female who presents for breast pain as per above.  Physical exam is pertinent for shotty masses palpable in bilateral lateral aspects of  breasts without fluctuance, induration, erythema, warmth, mildly tender to palpation, mobile.   The differential includes but is not limited to fibrocystic breast changes, pregnancy, malignancy.  Independent historian: None  External data reviewed: No pertinent external data  Labs: Not indicated  Radiology: Not indicated No results found.  EKG/Medicine tests: Not indicated EKG Interpretation:                  Interventions: None  See the EMR for full details regarding lab and imaging results.  Patient overall well-appearing on exam, vitally stable, exam reassuring.  Did consider pregnancy, however patient reports that she has not been sexually active in 2 years, thus would like to defer pregnancy test which I believe is reasonable.  Patient also without nontender, hard, immobile masses, therefore malignancy less likely, also bilateral appearance with temporal concurrence with menstrual cycle makes malignancy less likely, more likely fibrocystic breast changes.  I did consider obtaining a breast ultrasound while patient was in the emergency department, however unfortunately these are not available at [facility], do not feel that patient requires transfer for emergent ultrasound given symptoms are more consistent with fibrocystic breast changes.  No evidence of cellulitis, abscess on exam.  Overall given patient's risk factors, do feel that she should follow-up outpatient for a breast ultrasound, and patient expresses understanding.  Presentation is most consistent with acute uncomplicated illness  Discussion of management or test interpretations with external provider(s): Not indicated  Risk Drugs:None  Disposition: DISCHARGE: I believe that the patient is safe for discharge home with outpatient follow-up. Patient was informed of all pertinent physical exam, laboratory, and imaging findings.  Patient's suspected etiology of their symptom presentation was discussed with the patient and  all questions were answered. We discussed following up with PCP, OB/GYN. I provided thorough ED return precautions. The patient feels safe and comfortable with this plan.  MDM generated using voice dictation software and may contain dictation errors.  Please contact me for any clarification or with any questions.  Clinical Impression:  1. Fibrocystic breast changes of both breasts      Discharge   Final Clinical Impression(s) / ED Diagnoses Final diagnoses:  Fibrocystic breast changes of both breasts    Rx / DC Orders ED Discharge Orders          Ordered    Ambulatory Referral to Breast Specialist        02/24/24 1117             Rogelia Jerilynn RAMAN, MD 02/24/24 1150

## 2024-02-24 NOTE — Discharge Instructions (Addendum)
 Lauren Collins  Thank you for allowing us  to take care of you today.  You came to the Emergency Department today because you have small tender lumps palpable in both of your breasts, this is most consistent with fibrocystic breast changes, which is a change with your breast tissue that frequently occurs with menstrual cycle, please see the attached sheet for more information.  Given the labs are tender, mobile, and soft, they are less likely concerning for malignancy, however given your family history, you do need follow-up to rule out this possibility.  Additionally he did not have any symptoms such as warmth, fluctuance, redness that would be concerning for infection.  Should you develop overlying skin changes, redness, drainage from the wounds, nipple changes such as inversion of your nipple, bloody discharge from the nipple that would be a reason to come back to the emergency department for further evaluation.  Call [phone] to make an appointment at the breast center.  To-Do: 1. Please follow-up with your primary doctor within 1 - 2 weeks / as soon as possible.   Please return to the Emergency Department or call 911 if you experience have worsening of your symptoms, or do not get better, chest pain, shortness of breath, severe or significantly worsening pain, high fever, severe confusion, pass out or have any reason to think that you need emergency medical care.   We hope you feel better soon.   Mitzie Later, MD Department of Emergency Medicine [facility]

## 2024-02-24 NOTE — ED Triage Notes (Signed)
 Patient reports bilateral lumps on her breasts towards the axilla. First noted one 1 week ago, then the other this week. She says they are slightly tender to touch. Reports this has not happened in the past. On birth control shot.

## 2024-02-29 ENCOUNTER — Other Ambulatory Visit: Payer: Self-pay | Admitting: Nurse Practitioner

## 2024-02-29 DIAGNOSIS — N63 Unspecified lump in unspecified breast: Secondary | ICD-10-CM

## 2024-03-06 ENCOUNTER — Other Ambulatory Visit: Payer: Self-pay | Admitting: Medical Genetics

## 2024-03-08 ENCOUNTER — Other Ambulatory Visit (HOSPITAL_COMMUNITY)
Admission: RE | Admit: 2024-03-08 | Discharge: 2024-03-08 | Disposition: A | Discharge status: Home / Self Care | Payer: Self-pay | Source: Ambulatory Visit | Attending: Medical Genetics | Admitting: Medical Genetics

## 2024-03-13 ENCOUNTER — Ambulatory Visit
Admission: RE | Admit: 2024-03-13 | Discharge: 2024-03-13 | Disposition: A | Discharge status: Home / Self Care | Source: Ambulatory Visit | Attending: Nurse Practitioner | Admitting: Nurse Practitioner

## 2024-03-13 DIAGNOSIS — N63 Unspecified lump in unspecified breast: Secondary | ICD-10-CM

## 2024-03-20 LAB — GENECONNECT MOLECULAR SCREEN: Genetic Analysis Overall Interpretation: NEGATIVE
